# Patient Record
Sex: Male | Born: 1955
Health system: Southern US, Community
[De-identification: ages and names within clinical notes are randomized; demographics above are authoritative.]

## PROBLEM LIST (undated history)

## (undated) DIAGNOSIS — K5790 Diverticulosis of intestine, part unspecified, without perforation or abscess without bleeding: Secondary | ICD-10-CM

## (undated) DIAGNOSIS — D72819 Decreased white blood cell count, unspecified: Secondary | ICD-10-CM

## (undated) DIAGNOSIS — K635 Polyp of colon: Secondary | ICD-10-CM

## (undated) DIAGNOSIS — D571 Sickle-cell disease without crisis: Secondary | ICD-10-CM

## (undated) DIAGNOSIS — M19019 Primary osteoarthritis, unspecified shoulder: Secondary | ICD-10-CM

## (undated) HISTORY — PX: POLYPECTOMY: SHX149

## (undated) HISTORY — DX: Polyp of colon: K63.5

## (undated) HISTORY — PX: OTHER SURGICAL HISTORY: SHX169

## (undated) HISTORY — PX: COLONOSCOPY: SHX174

## (undated) HISTORY — PX: KNEE ARTHROSCOPY: SHX127

## (undated) HISTORY — DX: Diverticulosis of intestine, part unspecified, without perforation or abscess without bleeding: K57.90

## (undated) HISTORY — DX: Primary osteoarthritis, unspecified shoulder: M19.019

## (undated) HISTORY — DX: Sickle-cell disease without crisis: D57.1

---

## 1998-05-25 ENCOUNTER — Ambulatory Visit (HOSPITAL_COMMUNITY): Admission: RE | Admit: 1998-05-25 | Discharge: 1998-05-25 | Payer: Self-pay | Admitting: Family Medicine

## 1999-08-22 ENCOUNTER — Encounter: Admission: RE | Admit: 1999-08-22 | Discharge: 1999-08-22 | Payer: Self-pay | Admitting: Family Medicine

## 1999-08-22 ENCOUNTER — Encounter: Payer: Self-pay | Admitting: Family Medicine

## 2006-10-10 ENCOUNTER — Ambulatory Visit: Payer: Self-pay | Admitting: Gastroenterology

## 2006-10-25 ENCOUNTER — Ambulatory Visit: Payer: Self-pay | Admitting: Gastroenterology

## 2006-10-25 ENCOUNTER — Encounter (INDEPENDENT_AMBULATORY_CARE_PROVIDER_SITE_OTHER): Payer: Self-pay | Admitting: Specialist

## 2010-06-30 ENCOUNTER — Encounter
Admission: RE | Admit: 2010-06-30 | Discharge: 2010-06-30 | Payer: Self-pay | Source: Home / Self Care | Attending: Internal Medicine | Admitting: Internal Medicine

## 2011-09-06 ENCOUNTER — Encounter: Payer: Self-pay | Admitting: Gastroenterology

## 2011-09-11 ENCOUNTER — Encounter: Payer: Self-pay | Admitting: Gastroenterology

## 2011-10-30 ENCOUNTER — Ambulatory Visit (AMBULATORY_SURGERY_CENTER): Payer: 59 | Admitting: *Deleted

## 2011-10-30 ENCOUNTER — Encounter: Payer: Self-pay | Admitting: Gastroenterology

## 2011-10-30 VITALS — Ht 72.0 in | Wt 183.3 lb

## 2011-10-30 DIAGNOSIS — Z860100 Personal history of colon polyps, unspecified: Secondary | ICD-10-CM

## 2011-10-30 DIAGNOSIS — Z1211 Encounter for screening for malignant neoplasm of colon: Secondary | ICD-10-CM

## 2011-10-30 MED ORDER — PEG-KCL-NACL-NASULF-NA ASC-C 100 G PO SOLR: ORAL | Status: DC

## 2011-11-06 ENCOUNTER — Encounter: Payer: Self-pay | Admitting: Gastroenterology

## 2011-11-06 ENCOUNTER — Other Ambulatory Visit: Payer: Self-pay | Admitting: Gastroenterology

## 2011-11-06 ENCOUNTER — Ambulatory Visit (AMBULATORY_SURGERY_CENTER): Payer: 59 | Admitting: Gastroenterology

## 2011-11-06 VITALS — BP 125/68 | HR 60 | Temp 95.4°F | Resp 20 | Ht 72.0 in | Wt 183.0 lb

## 2011-11-06 DIAGNOSIS — Z1211 Encounter for screening for malignant neoplasm of colon: Secondary | ICD-10-CM

## 2011-11-06 DIAGNOSIS — Z86010 Personal history of colonic polyps: Secondary | ICD-10-CM

## 2011-11-06 MED ORDER — SODIUM CHLORIDE 0.9 % IV SOLN: 500.0000 mL | INTRAVENOUS | Status: DC

## 2011-11-06 NOTE — Progress Notes (Signed)
Patient did not experience any of the following events: a burn prior to discharge; a fall within the facility; wrong site/side/patient/procedure/implant event; or a hospital transfer or hospital admission upon discharge from the facility. (G8907) Patient did not have preoperative order for IV antibiotic SSI prophylaxis. (G8918)  

## 2011-11-06 NOTE — Op Note (Signed)
[facility] [address] [place], [place]  [address]  COLONOSCOPY PROCEDURE REPORT  PATIENT:  Charles Cannon, Charles Cannon  MR#:  [id number] BIRTHDATE:  08-28-1955, 56 yrs. old  GENDER:  male ENDOSCOPIST:  Judie Petit T. Russella Dar, MD, [facility]  PROCEDURE DATE:  11/06/2011 PROCEDURE:  Colonoscopy [address]2725 ASA CLASS:  Class II INDICATIONS:  1) Routine Risk Screening  2) history of hyperplastic polyps MEDICATIONS:   These medications were titrated to patient response per physician's verbal order, Fentanyl 50 mcg IV, Versed 5 mg IV DESCRIPTION OF PROCEDURE:   After the risks benefits and alternatives of the procedure were thoroughly explained, informed consent was obtained.  Digital rectal exam was performed and revealed no abnormalities.   The LB CF-[id number] endoscope was introduced through the anus and advanced to the cecum, which was identified by both the appendix and ileocecal valve, without limitations.  The quality of the prep was excellent, using MoviPrep.  The instrument was then slowly withdrawn as the colon was fully examined. <<PROCEDUREIMAGES>> FINDINGS:  A normal appearing cecum, ileocecal valve, and appendiceal orifice were identified. The ascending, hepatic flexure, transverse, splenic flexure, descending, sigmoid colon, and rectum appeared unremarkable.  Retroflexed views in the rectum revealed no abnormalities.  The time to cecum =  1.75  minutes. The scope was then withdrawn (time =  9.25  min) from the patient and the procedure completed.  COMPLICATIONS:  None  ENDOSCOPIC IMPRESSION: 1) Normal colon  RECOMMENDATIONS: 1) Continue current colorectal screening for "routine risk" patients with a repeat colonoscopy in 10 years.  Venita Lick. Russella Dar, MD, [facility]  CC:  Renford Dills MD  n. Rosalie DoctorVenita Lick. Adrina Armijo at 11/06/2011 10:49 AM  Trilby Drummer, [phone]

## 2011-11-06 NOTE — Patient Instructions (Signed)
YOU HAD AN ENDOSCOPIC PROCEDURE TODAY AT THE [facility]: Refer to the procedure report that was given to you for any specific questions about what was found during the examination.  If the procedure report does not answer your questions, please call your gastroenterologist to clarify.  If you requested that your care partner not be given the details of your procedure findings, then the procedure report has been included in a sealed envelope for you to review at your convenience later.  YOU SHOULD EXPECT: Some feelings of bloating in the abdomen. Passage of more gas than usual.  Walking can help get rid of the air that was put into your GI tract during the procedure and reduce the bloating. If you had a lower endoscopy (such as a colonoscopy or flexible sigmoidoscopy) you may notice spotting of blood in your stool or on the toilet paper. If you underwent a bowel prep for your procedure, then you may not have a normal bowel movement for a few days.  DIET: Your first meal following the procedure should be a light meal and then it is ok to progress to your normal diet.  A half-sandwich or bowl of soup is an example of a good first meal.  Heavy or fried foods are harder to digest and may make you feel nauseous or bloated.  Likewise meals heavy in dairy and vegetables can cause extra gas to form and this can also increase the bloating.  Drink plenty of fluids but you should avoid alcoholic beverages for 24 hours.  ACTIVITY: Your care partner should take you home directly after the procedure.  You should plan to take it easy, moving slowly for the rest of the day.  You can resume normal activity the day after the procedure however you should NOT DRIVE or use heavy machinery for 24 hours (because of the sedation medicines used during the test).    SYMPTOMS TO REPORT IMMEDIATELY: A gastroenterologist can be reached at any hour.  During normal business hours, 8:30 AM to 5:00 PM Monday through Friday,  call [phone].  After hours and on weekends, please call the GI answering service at [phone] who will take a message and have the physician on call contact you.   Following lower endoscopy (colonoscopy or flexible sigmoidoscopy):  Excessive amounts of blood in the stool  Significant tenderness or worsening of abdominal pains  Swelling of the abdomen that is new, acute  Fever of 100F or higher  FOLLOW UP: If any biopsies were taken you will be contacted by phone or by letter within the next 1-3 weeks.  Call your gastroenterologist if you have not heard about the biopsies in 3 weeks.  Our staff will call the home number listed on your records the next business day following your procedure to check on you and address any questions or concerns that you may have at that time regarding the information given to you following your procedure. This is a courtesy call and so if there is no answer at the home number and we have not heard from you through the emergency physician on call, we will assume that you have returned to your regular daily activities without incident.  SIGNATURES/CONFIDENTIALITY: You and/or your care partner have signed paperwork which will be entered into your electronic medical record.  These signatures attest to the fact that that the information above on your After Visit Summary has been reviewed and is understood.  Full responsibility of the confidentiality of this   discharge information lies with you and/or your care-partner.  Resume medications. 

## 2011-11-07 ENCOUNTER — Telehealth: Payer: Self-pay

## 2011-11-07 NOTE — Telephone Encounter (Signed)
  Follow up Call-  Call back number 11/06/2011  Post procedure Call Back phone  # [phone]  Permission to leave phone message Yes     Patient questions:  Do you have a fever, pain , or abdominal swelling? no Pain Score  0 *  Have you tolerated food without any problems? yes  Have you been able to return to your normal activities? yes  Do you have any questions about your discharge instructions: Diet   no Medications  no Follow up visit  no  Do you have questions or concerns about your Care? no  Actions: * If pain score is 4 or above: No action needed, pain <4.

## 2013-06-20 ENCOUNTER — Telehealth: Payer: Self-pay | Admitting: Internal Medicine

## 2013-06-20 NOTE — Telephone Encounter (Signed)
C/D 07/10/13 for appt. 06/20/13

## 2013-06-20 NOTE — Telephone Encounter (Signed)
S/W PT AND GVE NP APPT 12/10 @ 11 W/DR. MOHAMED.  REFERRING DR. RON POLITE DX- LOW WBC/LEUKOPENIA WELCOME PACKET MAILED.

## 2013-06-24 ENCOUNTER — Other Ambulatory Visit: Payer: Self-pay | Admitting: Medical Oncology

## 2013-06-24 DIAGNOSIS — D72819 Decreased white blood cell count, unspecified: Secondary | ICD-10-CM

## 2013-06-25 ENCOUNTER — Encounter: Payer: Self-pay | Admitting: Internal Medicine

## 2013-06-25 ENCOUNTER — Other Ambulatory Visit: Payer: Self-pay | Admitting: Internal Medicine

## 2013-06-25 ENCOUNTER — Ambulatory Visit (HOSPITAL_BASED_OUTPATIENT_CLINIC_OR_DEPARTMENT_OTHER): Payer: 59

## 2013-06-25 ENCOUNTER — Telehealth: Payer: Self-pay | Admitting: Internal Medicine

## 2013-06-25 ENCOUNTER — Encounter (INDEPENDENT_AMBULATORY_CARE_PROVIDER_SITE_OTHER): Payer: Self-pay

## 2013-06-25 ENCOUNTER — Ambulatory Visit: Payer: 59

## 2013-06-25 ENCOUNTER — Ambulatory Visit (HOSPITAL_BASED_OUTPATIENT_CLINIC_OR_DEPARTMENT_OTHER): Payer: 59 | Admitting: Internal Medicine

## 2013-06-25 VITALS — BP 137/81 | HR 79 | Temp 98.0°F | Resp 18 | Ht 72.0 in | Wt 190.0 lb

## 2013-06-25 DIAGNOSIS — D72819 Decreased white blood cell count, unspecified: Secondary | ICD-10-CM | POA: Insufficient documentation

## 2013-06-25 LAB — IRON AND TIBC CHCC: UIBC: 227 ug/dL (ref 117–376)

## 2013-06-25 LAB — COMPREHENSIVE METABOLIC PANEL (CC13)
ALT: 37 U/L (ref 0–55)
AST: 35 U/L — ABNORMAL HIGH (ref 5–34)
Albumin: 4.3 g/dL (ref 3.5–5.0)
Anion Gap: 10 mEq/L (ref 3–11)
BUN: 11 mg/dL (ref 7.0–26.0)
CO2: 22 mEq/L (ref 22–29)
Creatinine: 1.1 mg/dL (ref 0.7–1.3)
Total Bilirubin: 0.66 mg/dL (ref 0.20–1.20)

## 2013-06-25 LAB — CBC WITH DIFFERENTIAL/PLATELET
BASO%: 0.8 % (ref 0.0–2.0)
EOS%: 4.2 % (ref 0.0–7.0)
Eosinophils Absolute: 0.1 10*3/uL (ref 0.0–0.5)
HCT: 48.6 % (ref 38.4–49.9)
LYMPH%: 44.4 % (ref 14.0–49.0)
MCH: 33.5 pg — ABNORMAL HIGH (ref 27.2–33.4)
MCHC: 34.2 g/dL (ref 32.0–36.0)
MCV: 98.1 fL — ABNORMAL HIGH (ref 79.3–98.0)
NEUT%: 30.5 % — ABNORMAL LOW (ref 39.0–75.0)
Platelets: 176 10*3/uL (ref 140–400)
RBC: 4.95 10*6/uL (ref 4.20–5.82)

## 2013-06-25 LAB — FERRITIN CHCC: Ferritin: 329 ng/ml — ABNORMAL HIGH (ref 22–316)

## 2013-06-25 NOTE — Patient Instructions (Signed)
Followup visit in 2 weeks for evaluation and discussion of the pending lab results. 

## 2013-06-25 NOTE — Progress Notes (Signed)
Checked in new pt with no financial concerns. °

## 2013-06-25 NOTE — Progress Notes (Signed)
[facility] [phone]   [phone]  CONSULT NOTE  REFERRING PHYSICIAN: Dr. Renford Dills  REASON FOR CONSULTATION:  57 years old African American male with persistent Leukocytopenia  HPI Charles Cannon is a 57 y.o. male with no significant past medical history except for diverticulosis as well as cervical and lumbar disc disease and low back pain. The patient was seen recently by Dr. Nehemiah Settle for routine evaluation and annual exam. He had CBC performed on 06/17/2013 which showed white blood count of 2.1 with absolute neutrophil count of 600. The patient has normal hemoglobin of 15.6 and hematocrit 46.7% as well as normal platelets count of 190,000. BMP CBC on 05/31/2012 showed that your white blood count of 2.3 with absolute neutrophil count of 600. On 07/24/2012, his total white blood count was 2.9 with absolute neutrophil count of 1000. The patient mentions that he has not white blood count for more than 15 years. His primary care physician at that time Dr. Artis Flock was monitoring it by observation.  Dr. polite kindly referred the patient to me today for evaluation and recommendation regarding his persistent leukocytopenia. The patient is feeling fine with no specific complaints. He denied having any significant weight loss or night sweats. He has no palpable lymphadenopathy. He has no rheumatologic disorders and no history of hepatitis or HIV. He does not use any herbs or over the counter pain medication except occasional ibuprofen and Aleve for her back pain. He has never seen a hematologist in the past for evaluation of his condition and no bone marrow biopsy and aspirate were performed. He denied having any recurrent infection. His family history is remarkable for a mother with hypertension, dementia and stroke and maternal aunt with stomach cancer. The patient is married and has one biological daughter and 2 stepchildren. He works in several jobs including  transportation. He has no history of smoking but drinks around 6 packs of beer every weekend and no history of drug abuse. HPI  Past Medical History  Diagnosis Date  . Sickle cell anemia     has the trait  . Inflammation of shoulder joint     right    Past Surgical History  Procedure Laterality Date  . Tooth implant    . Colonoscopy    . Polypectomy      Family History  Problem Relation Age of Onset  . Colon cancer Neg Hx   . Esophageal cancer Neg Hx   . Rectal cancer Neg Hx   . Diabetes Brother   . Heart disease Brother   . Stomach cancer Maternal Aunt     Social History History  Substance Use Topics  . Smoking status: Never Smoker   . Smokeless tobacco: Never Used  . Alcohol Use: 3.6 oz/week    6 Cans of beer per week    No Known Allergies  Current Outpatient Prescriptions  Medication Sig Dispense Refill  . NON FORMULARY [employer] vital pack- takes 1 pack of vitamins daily      . VITAMIN E PO Take 1 tablet by mouth daily. Has fish oil, prostate vitamin      . CIALIS 20 MG tablet Take 20 mg by mouth as needed.       Current Facility-Administered Medications  Medication Dose Route Frequency Provider Last Rate Last Dose  . 0.9 %  sodium chloride infusion  500 mL Intravenous Continuous Meryl Dare, MD        Review of Systems  Constitutional:  negative Eyes: negative Ears, nose, mouth, throat, and face: negative Respiratory: negative Cardiovascular: negative Gastrointestinal: negative Genitourinary:negative Integument/breast: negative Hematologic/lymphatic: negative Musculoskeletal:positive for back pain Neurological: negative Behavioral/Psych: negative Endocrine: negative Allergic/Immunologic: negative  Physical Exam  WUJ:WJXBJ, healthy, no distress, well nourished and well developed SKIN: skin color, texture, turgor are normal, no rashes or significant lesions HEAD: Normocephalic, No masses, lesions, tenderness or abnormalities EYES: normal,  PERRLA EARS: External ears normal, Canals clear OROPHARYNX:no exudate, no erythema and lips, buccal mucosa, and tongue normal  NECK: supple, no adenopathy, no JVD LYMPH:  no palpable lymphadenopathy, no hepatosplenomegaly LUNGS: clear to auscultation , and palpation HEART: regular rate & rhythm, no murmurs and no gallops ABDOMEN:abdomen soft, non-tender, normal bowel sounds and no masses or organomegaly BACK: Back symmetric, no curvature., No CVA tenderness EXTREMITIES:no joint deformities, effusion, or inflammation, no edema, no skin discoloration, no clubbing  NEURO: alert & oriented x 3 with fluent speech, no focal motor/sensory deficits  PERFORMANCE STATUS: ECOG 0  LABORATORY DATA: Lab Results  Component Value Date   WBC 2.2* 06/25/2013   HGB 16.6 06/25/2013   HCT 48.6 06/25/2013   MCV 98.1* 06/25/2013   PLT 176 06/25/2013      Chemistry      Component Value Date/Time   NA 138 06/25/2013 1105   K 4.1 06/25/2013 1105   CO2 22 06/25/2013 1105   BUN 11.0 06/25/2013 1105   CREATININE 1.1 06/25/2013 1105      Component Value Date/Time   CALCIUM 9.9 06/25/2013 1105   ALKPHOS 53 06/25/2013 1105   AST 35* 06/25/2013 1105   ALT 37 06/25/2013 1105   BILITOT 0.66 06/25/2013 1105       RADIOGRAPHIC STUDIES: No results found.  ASSESSMENT: This is a very pleasant 57 years old [place] American male with persistent leukocytopenia and neutropenia most likely ethnic in origin but I cannot rule out any other etiology at this point. The patient has this abnormality for more than 15 years.   PLAN: I have a lengthy discussion with the patient today about his condition. I ordered several studies to evaluate his leukocytopenia including repeat CBC, comprehensive metabolic panel, LDH, vitamin B12, serum folate, ANA, rheumatoid factor, hepatitis panel and HIV. If no clear etiology from the above studies, I may consider the patient for a bone marrow biopsy and aspirate to rule out any bone  marrow abnormalities. I would see the patient back for followup visit in 2 weeks for reevaluation and discussion of his lab results. He was advised to call immediately if he has any concerning symptoms in the interval.  The patient voices understanding of current disease status and treatment options and is in agreement with the current care plan.  All questions were answered. The patient knows to call the clinic with any problems, questions or concerns. We can certainly see the patient much sooner if necessary.  Thank you so much for allowing me to participate in the care of Charles Cannon. I will continue to follow up the patient with you and assist in his care.  I spent 35 minutes counseling the patient face to face. The total time spent in the appointment was 55 minutes.  Jack Mineau K. 06/25/2013, 12:03 PM

## 2013-06-25 NOTE — Telephone Encounter (Signed)
gv and printed appt sched and avs for pt for DEc °

## 2013-06-26 LAB — HEPATITIS PANEL, ACUTE
HCV Ab: NEGATIVE
Hep A IgM: NONREACTIVE
Hep B C IgM: NONREACTIVE
Hepatitis B Surface Ag: NEGATIVE

## 2013-06-26 LAB — FOLATE: Folate: 16.3 ng/mL

## 2013-06-26 LAB — ANA: Anti Nuclear Antibody(ANA): POSITIVE — AB

## 2013-06-26 LAB — HIV ANTIBODY (ROUTINE TESTING W REFLEX): HIV: NONREACTIVE

## 2013-06-26 LAB — RHEUMATOID FACTOR: Rhuematoid fact SerPl-aCnc: <10 IU/mL (ref <=14)

## 2013-06-26 LAB — VITAMIN B12: Vitamin B-12: 588 pg/mL (ref 211–911)

## 2013-07-02 ENCOUNTER — Emergency Department (HOSPITAL_COMMUNITY): Payer: 59

## 2013-07-02 ENCOUNTER — Emergency Department (HOSPITAL_COMMUNITY)
Admission: EM | Admit: 2013-07-02 | Discharge: 2013-07-02 | Disposition: A | Discharge status: Home / Self Care | Payer: 59 | Attending: Emergency Medicine | Admitting: Emergency Medicine

## 2013-07-02 ENCOUNTER — Encounter (HOSPITAL_COMMUNITY): Payer: Self-pay | Admitting: Emergency Medicine

## 2013-07-02 DIAGNOSIS — Z862 Personal history of diseases of the blood and blood-forming organs and certain disorders involving the immune mechanism: Secondary | ICD-10-CM | POA: Insufficient documentation

## 2013-07-02 DIAGNOSIS — R197 Diarrhea, unspecified: Secondary | ICD-10-CM | POA: Insufficient documentation

## 2013-07-02 DIAGNOSIS — Z8739 Personal history of other diseases of the musculoskeletal system and connective tissue: Secondary | ICD-10-CM | POA: Insufficient documentation

## 2013-07-02 DIAGNOSIS — R1084 Generalized abdominal pain: Secondary | ICD-10-CM | POA: Insufficient documentation

## 2013-07-02 DIAGNOSIS — R109 Unspecified abdominal pain: Secondary | ICD-10-CM

## 2013-07-02 HISTORY — DX: Decreased white blood cell count, unspecified: D72.819

## 2013-07-02 LAB — CBC WITH DIFFERENTIAL/PLATELET
Basophils Absolute: 0 10*3/uL (ref 0.0–0.1)
Basophils Relative: 1 % (ref 0–1)
HCT: 45.4 % (ref 39.0–52.0)
Hemoglobin: 15.6 g/dL (ref 13.0–17.0)
Lymphocytes Relative: 26 % (ref 12–46)
MCHC: 34.4 g/dL (ref 30.0–36.0)
Monocytes Absolute: 0.3 10*3/uL (ref 0.1–1.0)
Monocytes Relative: 12 % (ref 3–12)
Neutro Abs: 1.5 10*3/uL — ABNORMAL LOW (ref 1.7–7.7)
Neutrophils Relative %: 61 % (ref 43–77)
RBC: 4.83 MIL/uL (ref 4.22–5.81)
RDW: 13.2 % (ref 11.5–15.5)
WBC: 2.4 10*3/uL — ABNORMAL LOW (ref 4.0–10.5)

## 2013-07-02 LAB — COMPREHENSIVE METABOLIC PANEL
ALT: 32 U/L (ref 0–53)
AST: 38 U/L — ABNORMAL HIGH (ref 0–37)
Albumin: 4.4 g/dL (ref 3.5–5.2)
Alkaline Phosphatase: 48 U/L (ref 39–117)
BUN: 11 mg/dL (ref 6–23)
CO2: 25 mEq/L (ref 19–32)
Chloride: 98 mEq/L (ref 96–112)
Creatinine, Ser: 1 mg/dL (ref 0.50–1.35)
GFR calc non Af Amer: 81 mL/min — ABNORMAL LOW (ref >90)
Potassium: 4.2 mEq/L (ref 3.5–5.1)
Sodium: 133 mEq/L — ABNORMAL LOW (ref 135–145)
Total Bilirubin: 0.6 mg/dL (ref 0.3–1.2)
Total Protein: 8.4 g/dL — ABNORMAL HIGH (ref 6.0–8.3)

## 2013-07-02 LAB — URINALYSIS W MICROSCOPIC + REFLEX CULTURE
Glucose, UA: NEGATIVE mg/dL
Hgb urine dipstick: NEGATIVE
Leukocytes, UA: NEGATIVE
Specific Gravity, Urine: 1.018 (ref 1.005–1.030)
Urine-Other: NONE SEEN
Urobilinogen, UA: 0.2 mg/dL (ref 0.0–1.0)

## 2013-07-02 LAB — LACTIC ACID, PLASMA: Lactic Acid, Venous: 1.8 mmol/L (ref 0.5–2.2)

## 2013-07-02 MED ORDER — SODIUM CHLORIDE 0.9 % IV SOLN
INTRAVENOUS | Status: DC
  Administered 2013-07-02: 14:00:00 via INTRAVENOUS

## 2013-07-02 MED ORDER — IOHEXOL 300 MG/ML  SOLN
100.0000 mL | Freq: Once | INTRAMUSCULAR | Status: AC | PRN
  Administered 2013-07-02: 100 mL via INTRAVENOUS

## 2013-07-02 MED ORDER — DICYCLOMINE HCL 20 MG PO TABS: ORAL_TABLET | ORAL | Status: DC

## 2013-07-02 MED ORDER — IOHEXOL 300 MG/ML  SOLN
50.0000 mL | Freq: Once | INTRAMUSCULAR | Status: AC | PRN
  Administered 2013-07-02: 50 mL via ORAL

## 2013-07-02 MED ORDER — DIPHENOXYLATE-ATROPINE 2.5-0.025 MG PO TABS: 1.0000 | ORAL_TABLET | Freq: Four times a day (QID) | ORAL | Status: DC | PRN

## 2013-07-02 MED ORDER — TRAMADOL HCL 50 MG PO TABS: 50.0000 mg | ORAL_TABLET | Freq: Four times a day (QID) | ORAL | Status: DC | PRN

## 2013-07-02 NOTE — ED Provider Notes (Signed)
CSN: [id number]     Arrival date & time 07/02/13  1257 History   First MD Initiated Contact with Patient 07/02/13 1317     Chief Complaint  Patient presents with  . Abdominal Pain  . Diarrhea    HPI Pt was seen at 1340.  Per pt, c/o gradual onset and persistence of constant generalized abd "pain" since this morning approximately 0500.  Has been associated with multiple intermittent episodes of "loose stools."  Describes the abd pain as "constant." Pt was evaluated at a local [facility], then sent to the ED for further evaluation. Denies diarrhea, no N/V, no fevers, no back pain, no rash, no CP/SOB, no black or blood in stools.       Past Medical History  Diagnosis Date  . Sickle cell anemia     has the trait  . Inflammation of shoulder joint     right  . Leukopenia    Past Surgical History  Procedure Laterality Date  . Tooth implant    . Colonoscopy    . Polypectomy     Family History  Problem Relation Age of Onset  . Colon cancer Neg Hx   . Esophageal cancer Neg Hx   . Rectal cancer Neg Hx   . Diabetes Brother   . Heart disease Brother   . Stomach cancer Maternal Aunt    History  Substance Use Topics  . Smoking status: Never Smoker   . Smokeless tobacco: Never Used  . Alcohol Use: 3.6 oz/week    6 Cans of beer per week    Review of Systems ROS: Statement: All systems negative except as marked or noted in the HPI; Constitutional: Negative for fever and chills. ; ; Eyes: Negative for eye pain, redness and discharge. ; ; ENMT: Negative for ear pain, hoarseness, nasal congestion, sinus pressure and sore throat. ; ; Cardiovascular: Negative for chest pain, palpitations, diaphoresis, dyspnea and peripheral edema. ; ; Respiratory: Negative for cough, wheezing and stridor. ; ; Gastrointestinal: +"loose stools," abd pain. Negative for nausea, vomiting, blood in stool, hematemesis, jaundice and rectal bleeding. . ; ; Genitourinary: Negative for dysuria, flank pain and hematuria. ; ;  Musculoskeletal: Negative for back pain and neck pain. Negative for swelling and trauma.; ; Skin: Negative for pruritus, rash, abrasions, blisters, bruising and skin lesion.; ; Neuro: Negative for headache, lightheadedness and neck stiffness. Negative for weakness, altered level of consciousness , altered mental status, extremity weakness, paresthesias, involuntary movement, seizure and syncope.      Allergies  Review of patient's allergies indicates no known allergies.  Home Medications   Current Outpatient Rx  Name  Route  Sig  Dispense  Refill  . CIALIS 20 MG tablet   Oral   Take 20 mg by mouth daily as needed for erectile dysfunction.          . NON FORMULARY   Oral   Take 1 packet by mouth daily. [employer] vital pack- multi-vitamin pack          BP 139/80  Pulse 59  Temp(Src) 98.3 F (36.8 C) (Oral)  Resp 20  SpO2 98% Physical Exam 1345: Physical examination:  Nursing notes reviewed; Vital signs and O2 SAT reviewed;  Constitutional: Well developed, Well nourished, Well hydrated, In no acute distress; Head:  Normocephalic, atraumatic; Eyes: EOMI, PERRL, No scleral icterus; ENMT: Mouth and pharynx normal, Mucous membranes moist; Neck: Supple, Full range of motion, No lymphadenopathy; Cardiovascular: Regular rate and rhythm, No gallop; Respiratory: Breath sounds clear &  equal bilaterally, No wheezes.  Speaking full sentences with ease, Normal respiratory effort/excursion; Chest: Nontender, Movement normal; Abdomen: Soft, +mild diffuse tenderness to palp. No rebound or guarding. Nondistended, Normal bowel sounds; Genitourinary: No CVA tenderness; Extremities: Pulses normal, No tenderness, No edema, No calf edema or asymmetry.; Neuro: AA&Ox3, Major CN grossly intact.  Speech clear. No gross focal motor or sensory deficits in extremities.; Skin: Color normal, Warm, Dry.   ED Course  Procedures    EKG Interpretation   None       MDM  MDM Reviewed: previous chart, nursing note  and vitals Reviewed previous: labs Interpretation: labs and x-ray   Results for orders placed during the hospital encounter of 07/02/13  URINALYSIS W MICROSCOPIC + REFLEX CULTURE      Result Value Range   Color, Urine YELLOW  YELLOW   APPearance CLEAR  CLEAR   Specific Gravity, Urine 1.018  1.005 - 1.030   pH 7.0  5.0 - 8.0   Glucose, UA NEGATIVE  NEGATIVE mg/dL   Hgb urine dipstick NEGATIVE  NEGATIVE   Bilirubin Urine NEGATIVE  NEGATIVE   Ketones, ur NEGATIVE  NEGATIVE mg/dL   Protein, ur NEGATIVE  NEGATIVE mg/dL   Urobilinogen, UA 0.2  0.0 - 1.0 mg/dL   Nitrite NEGATIVE  NEGATIVE   Leukocytes, UA NEGATIVE  NEGATIVE   Urine-Other       Value: NO FORMED ELEMENTS SEEN ON URINE MICROSCOPIC EXAMINATION  CBC WITH DIFFERENTIAL      Result Value Range   WBC 2.4 (*) 4.0 - 10.5 K/uL   RBC 4.83  4.22 - 5.81 MIL/uL   Hemoglobin 15.6  13.0 - 17.0 g/dL   HCT 09.8  11.9 - 14.7 %   MCV 94.0  78.0 - 100.0 fL   MCH 32.3  26.0 - 34.0 pg   MCHC 34.4  30.0 - 36.0 g/dL   RDW 82.9  56.2 - 13.0 %   Platelets 197  150 - 400 K/uL   Neutrophils Relative % 61  43 - 77 %   Neutro Abs 1.5 (*) 1.7 - 7.7 K/uL   Lymphocytes Relative 26  12 - 46 %   Lymphs Abs 0.6 (*) 0.7 - 4.0 K/uL   Monocytes Relative 12  3 - 12 %   Monocytes Absolute 0.3  0.1 - 1.0 K/uL   Eosinophils Relative 0  0 - 5 %   Eosinophils Absolute 0.0  0.0 - 0.7 K/uL   Basophils Relative 1  0 - 1 %   Basophils Absolute 0.0  0.0 - 0.1 K/uL  COMPREHENSIVE METABOLIC PANEL      Result Value Range   Sodium 133 (*) 135 - 145 mEq/L   Potassium 4.2  3.5 - 5.1 mEq/L   Chloride 98  96 - 112 mEq/L   CO2 25  19 - 32 mEq/L   Glucose, Bld 102 (*) 70 - 99 mg/dL   BUN 11  6 - 23 mg/dL   Creatinine, Ser 8.65  0.50 - 1.35 mg/dL   Calcium 9.4  8.4 - 78.4 mg/dL   Total Protein 8.4 (*) 6.0 - 8.3 g/dL   Albumin 4.4  3.5 - 5.2 g/dL   AST 38 (*) 0 - 37 U/L   ALT 32  0 - 53 U/L   Alkaline Phosphatase 48  39 - 117 U/L   Total Bilirubin 0.6  0.3 - 1.2  mg/dL   GFR calc non Af Amer 81 (*) >90 mL/min   GFR calc  Af Amer >90  >90 mL/min  LIPASE, BLOOD      Result Value Range   Lipase 27  11 - 59 U/L  LACTIC ACID, PLASMA      Result Value Range   Lactic Acid, Venous 1.8  0.5 - 2.2 mmol/L   Dg Chest 2 View 07/02/2013   CLINICAL DATA:  Abdominal pain.  EXAM: CHEST  2 VIEW  COMPARISON:  None.  FINDINGS: Trachea is midline. Heart size normal. Lungs are clear. No pleural fluid.  IMPRESSION: No acute findings.   Electronically Signed   By: Leanna Battles M.D.   On: 07/02/2013 15:08    Results for RAMEL, TOBON (MRN [id number]) as of 07/02/2013 15:56  Ref. Range 06/25/2013 11:05 07/02/2013 14:01  WBC Latest Range: 4.0-10.5 K/uL 2.2 (L) 2.4 (L)     1600:   CT A/P pending. Sign out to Dr. Fayrene Fearing.     Laray Anger, DO 07/02/13 [phone]

## 2013-07-02 NOTE — ED Provider Notes (Signed)
Care was discussed between myself and Dr. Clarene Duke. Was asked to follow up with his CT scans. CT is reported as normal and without acute pathology by radiology. Patient is without symptoms now his abdomen is benign. His discharge home. Prescription Bentyl and Lomotil.  Charles Marion, MD 07/02/13 [phone]

## 2013-07-02 NOTE — ED Notes (Signed)
Pt c/o abd pain since 0500 this morning along with diarrhea, states he went abut 3 times this morning. Denies n/v.

## 2013-07-02 NOTE — ED Notes (Signed)
Pt given urinal and made aware of need for urine specimen 

## 2013-07-02 NOTE — ED Notes (Signed)
Pt sent by [facility].  C/o abdominal pain and "loose" stool x 2.

## 2013-07-09 ENCOUNTER — Telehealth: Payer: Self-pay | Admitting: Medical Oncology

## 2013-07-09 ENCOUNTER — Other Ambulatory Visit: Payer: Self-pay | Admitting: Medical Oncology

## 2013-07-09 ENCOUNTER — Other Ambulatory Visit (HOSPITAL_BASED_OUTPATIENT_CLINIC_OR_DEPARTMENT_OTHER): Payer: 59

## 2013-07-09 ENCOUNTER — Encounter: Payer: Self-pay | Admitting: Internal Medicine

## 2013-07-09 ENCOUNTER — Telehealth: Payer: Self-pay | Admitting: Internal Medicine

## 2013-07-09 ENCOUNTER — Telehealth: Payer: Self-pay | Admitting: *Deleted

## 2013-07-09 ENCOUNTER — Ambulatory Visit (HOSPITAL_BASED_OUTPATIENT_CLINIC_OR_DEPARTMENT_OTHER): Payer: 59 | Admitting: Internal Medicine

## 2013-07-09 ENCOUNTER — Ambulatory Visit (HOSPITAL_BASED_OUTPATIENT_CLINIC_OR_DEPARTMENT_OTHER): Payer: 59

## 2013-07-09 VITALS — BP 141/86 | HR 84 | Temp 97.8°F | Resp 18 | Ht 72.0 in | Wt 189.0 lb

## 2013-07-09 DIAGNOSIS — D709 Neutropenia, unspecified: Secondary | ICD-10-CM

## 2013-07-09 DIAGNOSIS — D72819 Decreased white blood cell count, unspecified: Secondary | ICD-10-CM

## 2013-07-09 LAB — CBC WITH DIFFERENTIAL/PLATELET
BASO%: 0.4 % (ref 0.0–2.0)
Basophils Absolute: 0 10*3/uL (ref 0.0–0.1)
EOS%: 2.5 % (ref 0.0–7.0)
HCT: 45.7 % (ref 38.4–49.9)
HGB: 15.9 g/dL (ref 13.0–17.1)
LYMPH%: 64.4 % — ABNORMAL HIGH (ref 14.0–49.0)
MCH: 33.1 pg (ref 27.2–33.4)
MCHC: 34.8 g/dL (ref 32.0–36.0)
MCV: 95 fL (ref 79.3–98.0)
MONO%: 13.6 % (ref 0.0–14.0)
NEUT#: 0.5 10*3/uL — CL (ref 1.5–6.5)
NEUT%: 19.1 % — ABNORMAL LOW (ref 39.0–75.0)
Platelets: 201 10*3/uL (ref 140–400)
RDW: 13.8 % (ref 11.0–14.6)
WBC: 2.4 10*3/uL — ABNORMAL LOW (ref 4.0–10.3)
lymph#: 1.5 10*3/uL (ref 0.9–3.3)
nRBC: 0 % (ref 0–0)

## 2013-07-09 MED ORDER — FILGRASTIM 480 MCG/0.8ML IJ SOLN
480.0000 ug | Freq: Once | INTRAMUSCULAR | Status: AC
  Administered 2013-07-09: 480 ug via SUBCUTANEOUS
  Filled 2013-07-09: qty 0.8

## 2013-07-09 NOTE — Telephone Encounter (Signed)
SW pt adv time of bx on 1/21 shh

## 2013-07-09 NOTE — Patient Instructions (Signed)
Bone marrow biopsy and aspirate on 07/25/2013 Follow up visit in one month

## 2013-07-09 NOTE — Telephone Encounter (Signed)
Dr Arbutus Ped request BM asp and Bx on jan 9th . onc tx request sent to scheduler.

## 2013-07-09 NOTE — Progress Notes (Signed)
[facility] [phone]   [phone]  OFFICE PROGRESS NOTE  Katy Apo, MD [address] [place] [address]  DIAGNOSIS: Leukocytopenia and neutropenia of unknown etiology.  PRIOR THERAPY: None  CURRENT THERAPY: None  INTERVAL HISTORY: Charles Cannon 57 y.o. male returns to the clinic today for follow up visit. The patient is feeling fine today with no specific complaints. He was recently seen at the emergency department after having viral gastroenteritis for 24 hours. He is feeling much better today. He had several studies performed recently for evaluation of leukocytopenia and neutropenia. The studies include iron study and ferritin that was normal. Rheumatoid factor was less than 10, ANA positive with a titer of 1:40 speckled. Hepatitis panel and HIV were negative. Serum folate and vitamin B12 were both normal. The patient is here today for evaluation and discussion of his lab results and recommendation regarding his condition.  MEDICAL HISTORY: Past Medical History  Diagnosis Date  . Sickle cell anemia     has the trait  . Inflammation of shoulder joint     right  . Leukopenia     ALLERGIES:  has No Known Allergies.  MEDICATIONS:  Current Outpatient Prescriptions  Medication Sig Dispense Refill  . NON FORMULARY Take 1 packet by mouth daily. [employer] vital pack- multi-vitamin pack      . CIALIS 20 MG tablet Take 20 mg by mouth daily as needed for erectile dysfunction.        Current Facility-Administered Medications  Medication Dose Route Frequency Provider Last Rate Last Dose  . 0.9 %  sodium chloride infusion  500 mL Intravenous Continuous Meryl Dare, MD        SURGICAL HISTORY:  Past Surgical History  Procedure Laterality Date  . Tooth implant    . Colonoscopy    . Polypectomy      REVIEW OF SYSTEMS:  A comprehensive review of systems was negative.   PHYSICAL EXAMINATION: General appearance:  alert, cooperative and no distress Head: Normocephalic, without obvious abnormality, atraumatic Neck: no adenopathy, no JVD, supple, symmetrical, trachea midline and thyroid not enlarged, symmetric, no tenderness/mass/nodules Lymph nodes: Cervical, supraclavicular, and axillary nodes normal. Resp: clear to auscultation bilaterally Back: symmetric, no curvature. ROM normal. No CVA tenderness. Cardio: regular rate and rhythm, S1, S2 normal, no murmur, click, rub or gallop GI: soft, non-tender; bowel sounds normal; no masses,  no organomegaly Extremities: extremities normal, atraumatic, no cyanosis or edema  ECOG PERFORMANCE STATUS: 0 - Asymptomatic  Blood pressure 141/86, pulse 84, temperature 97.8 F (36.6 C), temperature source Oral, resp. rate 18, height 6' (1.829 m), weight 189 lb (85.73 kg), SpO2 100.00%.  LABORATORY DATA: Lab Results  Component Value Date   WBC 2.4* 07/09/2013   HGB 15.9 07/09/2013   HCT 45.7 07/09/2013   MCV 95.0 07/09/2013   PLT 201 07/09/2013      Chemistry      Component Value Date/Time   NA 133* 07/02/2013 1401   NA 138 06/25/2013 1105   K 4.2 07/02/2013 1401   K 4.1 06/25/2013 1105   CL 98 07/02/2013 1401   CO2 25 07/02/2013 1401   CO2 22 06/25/2013 1105   BUN 11 07/02/2013 1401   BUN 11.0 06/25/2013 1105   CREATININE 1.00 07/02/2013 1401   CREATININE 1.1 06/25/2013 1105      Component Value Date/Time   CALCIUM 9.4 07/02/2013 1401   CALCIUM 9.9 06/25/2013 1105   ALKPHOS  48 07/02/2013 1401   ALKPHOS 53 06/25/2013 1105   AST 38* 07/02/2013 1401   AST 35* 06/25/2013 1105   ALT 32 07/02/2013 1401   ALT 37 06/25/2013 1105   BILITOT 0.6 07/02/2013 1401   BILITOT 0.66 06/25/2013 1105       RADIOGRAPHIC STUDIES: Dg Chest 2 View  07/02/2013   CLINICAL DATA:  Abdominal pain.  EXAM: CHEST  2 VIEW  COMPARISON:  None.  FINDINGS: Trachea is midline. Heart size normal. Lungs are clear. No pleural fluid.  IMPRESSION: No acute findings.    Electronically Signed   By: Leanna Battles M.D.   On: 07/02/2013 15:08   Ct Abdomen Pelvis W Contrast  07/02/2013   CLINICAL DATA:  Abdominal pain and diarrhea.  EXAM: CT ABDOMEN AND PELVIS WITH CONTRAST  TECHNIQUE: Multidetector CT imaging of the abdomen and pelvis was performed using the standard protocol following bolus administration of intravenous contrast.  CONTRAST:  OMNIPAQUE IOHEXOL 300 MG/ML  SOLN  COMPARISON:  06/30/2010  FINDINGS: Lung bases are clear.  No pleural or pericardial fluid.  The liver has a normal appearance without focal lesions or biliary ductal dilatation. No calcified gallstones. The spleen is normal. The pancreas is normal. The adrenal glands are normal. The kidneys are normal. No cysts, mass, stone or hydronephrosis. The aorta and IVC are normal. No retroperitoneal mass or adenopathy. No free intraperitoneal fluid or air. Bladder appears normal.  The bowel appears unremarkable. No dilated loops. No areas of wall thickening. The appendix is normal.  IMPRESSION: Normal CT scan of the abdomen and pelvis. No bowel or organ pathology evident.   Electronically Signed   By: Paulina Fusi M.D.   On: 07/02/2013 16:12    ASSESSMENT AND PLAN: this is a very pleasant 57 years old [place] American male with persistent leukocytosis and neutropenia of unclear etiology at this point. His absolute neutrophil count is 500 today and this could be the result of recent viral gastroenteritis. I recommend for the patient to have a Neupogen injection 480 mcg subcutaneously x1 dose today to improve his neutrophil count. I also discussed with the patient and proceeding with a bone marrow biopsy and aspirate to rule out any other bone marrow abnormalities causing the leukocytopenia and neutropenia. He would come back for follow up visit in one month's for reevaluation and discussion of his biopsy results. He was advised to call immediately if he has any concerning symptoms in the interval. The  patient voices understanding of current disease status and treatment options and is in agreement with the current care plan.  All questions were answered. The patient knows to call the clinic with any problems, questions or concerns. We can certainly see the patient much sooner if necessary.  I spent 15 minutes counseling the patient face to face. The total time spent in the appointment was 25 minutes.

## 2013-07-09 NOTE — Telephone Encounter (Signed)
appts made per 12/24 POF email to MW to add Bone Marrow Bx Call pt at cell number for time of Bx on 01/21 when hear back from [facility] shh

## 2013-07-09 NOTE — Telephone Encounter (Signed)
Per staff message and POF I have scheduled appts.  JMW  

## 2013-07-25 ENCOUNTER — Ambulatory Visit (HOSPITAL_BASED_OUTPATIENT_CLINIC_OR_DEPARTMENT_OTHER): Payer: 59 | Admitting: Internal Medicine

## 2013-07-25 ENCOUNTER — Other Ambulatory Visit (HOSPITAL_BASED_OUTPATIENT_CLINIC_OR_DEPARTMENT_OTHER): Payer: 59

## 2013-07-25 ENCOUNTER — Other Ambulatory Visit (HOSPITAL_COMMUNITY)
Admission: RE | Admit: 2013-07-25 | Discharge: 2013-07-25 | Disposition: A | Discharge status: Home / Self Care | Payer: 59 | Source: Ambulatory Visit | Attending: Internal Medicine | Admitting: Internal Medicine

## 2013-07-25 ENCOUNTER — Encounter: Payer: Self-pay | Admitting: Internal Medicine

## 2013-07-25 VITALS — BP 113/64 | HR 57 | Temp 98.1°F | Resp 18

## 2013-07-25 DIAGNOSIS — D709 Neutropenia, unspecified: Secondary | ICD-10-CM

## 2013-07-25 DIAGNOSIS — D72819 Decreased white blood cell count, unspecified: Secondary | ICD-10-CM | POA: Insufficient documentation

## 2013-07-25 LAB — CBC WITH DIFFERENTIAL/PLATELET
BASO%: 1.9 % (ref 0.0–2.0)
Basophils Absolute: 0 10*3/uL (ref 0.0–0.1)
EOS%: 3.7 % (ref 0.0–7.0)
Eosinophils Absolute: 0.1 10*3/uL (ref 0.0–0.5)
HEMATOCRIT: 45.7 % (ref 38.4–49.9)
HGB: 15.5 g/dL (ref 13.0–17.1)
LYMPH%: 57.9 % — ABNORMAL HIGH (ref 14.0–49.0)
MCH: 33.3 pg (ref 27.2–33.4)
MCHC: 33.9 g/dL (ref 32.0–36.0)
MCV: 98 fL (ref 79.3–98.0)
MONO#: 0.3 10*3/uL (ref 0.1–0.9)
MONO%: 16.6 % — ABNORMAL HIGH (ref 0.0–14.0)
NEUT#: 0.3 10*3/uL — CL (ref 1.5–6.5)
NEUT%: 19.9 % — AB (ref 39.0–75.0)
Platelets: 178 10*3/uL (ref 140–400)
RBC: 4.66 10*6/uL (ref 4.20–5.82)
RDW: 13.3 % (ref 11.0–14.6)
WBC: 1.7 10*3/uL — ABNORMAL LOW (ref 4.0–10.3)
lymph#: 1 10*3/uL (ref 0.9–3.3)

## 2013-07-25 LAB — COMPREHENSIVE METABOLIC PANEL (CC13)
ALBUMIN: 4.1 g/dL (ref 3.5–5.0)
ALT: 31 U/L (ref 0–55)
ANION GAP: 7 meq/L (ref 3–11)
AST: 32 U/L (ref 5–34)
Alkaline Phosphatase: 62 U/L (ref 40–150)
BUN: 13.3 mg/dL (ref 7.0–26.0)
CALCIUM: 9.4 mg/dL (ref 8.4–10.4)
CHLORIDE: 107 meq/L (ref 98–109)
CO2: 27 meq/L (ref 22–29)
CREATININE: 1.1 mg/dL (ref 0.7–1.3)
Glucose: 98 mg/dl (ref 70–140)
POTASSIUM: 4 meq/L (ref 3.5–5.1)
Sodium: 141 mEq/L (ref 136–145)
Total Bilirubin: 0.76 mg/dL (ref 0.20–1.20)
Total Protein: 7.8 g/dL (ref 6.4–8.3)

## 2013-07-25 LAB — BONE MARROW EXAM

## 2013-07-25 LAB — LACTATE DEHYDROGENASE (CC13): LDH: 147 U/L (ref 125–245)

## 2013-07-25 NOTE — Patient Instructions (Addendum)
[facility] Discharge Instructions for Post Bone Marrow Procedure  Today you had a bone marrow biopsy and aspirate of Right hip Please keep the pressure dressing in place for at least 24 hours.  Have someone check your dressing periodically for bleeding.  If needed you can reapply a pressure dressing to the site.  Take pain medication  as directed.  IF BLEEDING REOCCURS THAT SHOULD BE REPORTED IMMEDIATELY. Call the [facility] at [phone] if during business hours. Or report to the Emergency Room.   I have been informed and understand all the instructions given to me. I know to contact the clinic, my physician, or go to the Emergency Department if any problems should occur. I do not have any questions at this time, but understand that I may call the clinic during office hours at [phone]  should I have any questions or need assistance in obtaining follow up care.    __________________________________________  _____________  __________ Signature of Patient or Authorized Representative            Date                   Time    __________________________________________ Nurse's Signature    Neutropenia Neutropenia is a condition that occurs when the level of a certain type of white blood cell (neutrophil) in your body becomes lower than normal. Neutrophils are made in the bone marrow and fight infections. These cells protect against bacteria and viruses. The fewer neutrophils you have, and the longer your body remains without them, the greater your risk of getting a severe infection becomes. CAUSES  The cause of neutropenia may be hard to determine. However, it is usually due to 3 main problems:   Decreased production of neutrophils. This may be due to:  Certain medicines such as chemotherapy.  Genetic problems.  Cancer.  Radiation treatments.  Vitamin deficiency.  Some pesticides.  Increased destruction of neutrophils. This may be due  to:  Overwhelming infections.  Hemolytic anemia. This is when the body destroys its own blood cells.  Chemotherapy.  Neutrophils moving to areas of the body where they cannot fight infections. This may be due to:  Dialysis procedures.  Conditions where the spleen becomes enlarged. Neutrophils are held in the spleen and are not available to the rest of the body.  Overwhelming infections. The neutrophils are held in the area of the infection and are not available to the rest of the body. SYMPTOMS  There are no specific symptoms of neutropenia. The lack of neutrophils can result in an infection, and an infection can cause various problems. DIAGNOSIS  Diagnosis is made by a blood test. A complete blood count is performed. The normal level of neutrophils in human blood differs with age and race. Infants have lower counts than older children and adults. African Americans have lower counts than Caucasians or Asians. The average adult level is 1500 cells/mm3 of blood. Neutrophil counts are interpreted as follows:  Greater than 1000 cells/mm3 gives normal protection against infection.  500 to 1000 cells/mm3 gives an increased risk for infection.  200 to 500 cells/mm3 is a greater risk for severe infection.  Lower than 200 cells/mm3 is a marked risk of infection. This may require hospitalization and treatment with antibiotic medicines. TREATMENT  Treatment depends on the underlying cause, severity, and presence of infections or symptoms. It also depends on your health. Your caregiver will discuss the treatment plan with you. Mild cases are often easily  treated and have a good outcome. Preventative measures may also be started to limit your risk of infections. Treatment can include:  Taking antibiotics.  Stopping medicines that are known to cause neutropenia.  Correcting nutritional deficiencies by eating green vegetables to supply folic acid and taking vitamin B supplements.  Stopping  exposure to pesticides if your neutropenia is related to pesticide exposure.  Taking a blood growth factor called sargramostim, pegfilgrastim, or filgrastim if you are undergoing chemotherapy for cancer. This stimulates white blood cell production.  Removal of the spleen if you have Felty's syndrome and have repeated infections. HOME CARE INSTRUCTIONS   Follow your caregiver's instructions about when you need to have blood work done.  Wash your hands often. Make sure others who come in contact with you also wash their hands.  Wash raw fruits and vegetables before eating them. They can carry bacteria and fungi.  Avoid people with colds or spreadable (contagious) diseases (chickenpox, herpes zoster, influenza).  Avoid large crowds.  Avoid construction areas. The dust can release fungus into the air.  Be cautious around children in daycare or school environments.  Take care of your respiratory system by coughing and deep breathing.  Bathe daily.  Protect your skin from cuts and burns.  Do not work in the garden or with flowers and plants.  Care for the mouth before and after meals by brushing with a soft toothbrush. If you have mucositis, do not use mouthwash. Mouthwash contains alcohol and can dry out the mouth even more.  Clean the area between the genitals and the anus (perineal area) after urination and bowel movements. Women need to wipe from front to back.  Use a water soluble lubricant during sexual intercourse and practice good hygiene after. Do not have intercourse if you are severely neutropenic. Check with your caregiver for guidelines.  Exercise daily as tolerated.  Avoid people who were vaccinated with a live vaccine in the past 30 days. You should not receive live vaccines (polio, typhoid).  Do not provide direct care for pets. Avoid animal droppings. Do not clean litter boxes and bird cages.  Do not share food utensils.  Do not use tampons, enemas, or rectal  suppositories unless directed by your caregiver.  Use an electric razor to remove hair.  Wash your hands after handling magazines, letters, and newspapers. SEEK IMMEDIATE MEDICAL CARE IF:   You have a fever of 100.5F or higher  You have chills or start to shake.  You feel nauseous or vomit.  You develop mouth sores.  You develop aches and pains.  You have redness and swelling around open wounds.  Your skin is warm to the touch.  You have pus coming from your wounds.  You develop swollen lymph nodes.  You feel weak or fatigued.  You develop red streaks on the skin. MAKE SURE YOU:  Understand these instructions.  Will watch your condition.  Will get help right away if you are not doing well or get worse. Document Released: 12/23/2001 Document Revised: 09/25/2011 Document Reviewed: 01/20/2011 Exit[employer] Patient Information 2014 [employer].  

## 2013-07-25 NOTE — Progress Notes (Signed)
Bone Marrow Biopsy and Aspiration Procedure Note   Informed consent was obtained and potential risks including bleeding, infection and pain were reviewed with the patient.   Posterior iliac crest(s) prepped with Betadine.   Lidocaine 2% local anesthesia infiltrated into the subcutaneous tissue.  Right bone marrow biopsy and right bone marrow aspirate was obtained.   The procedure was tolerated well and there were no complications.  Specimens sent for: routine histopathologic stains and sectioning, flow cytometry and cytogenetics  Physician: MOHAMED,MOHAMED K. 

## 2013-07-25 NOTE — Progress Notes (Signed)
Pressure dressing over Bone marrow site intact with dime size spot of blood.Pt verbalizes understanding to call for any concerns such as increased bleeding at site and temp >100.5 f.  Neutropenic precautions reviewed with pt.  Pt instructed to keep dressing intact x 24 hours.

## 2013-08-05 LAB — CHROMOSOME ANALYSIS, BONE MARROW

## 2013-08-06 ENCOUNTER — Other Ambulatory Visit: Payer: 59

## 2013-08-13 ENCOUNTER — Encounter: Payer: Self-pay | Admitting: Internal Medicine

## 2013-08-13 ENCOUNTER — Other Ambulatory Visit (HOSPITAL_BASED_OUTPATIENT_CLINIC_OR_DEPARTMENT_OTHER): Payer: 59

## 2013-08-13 ENCOUNTER — Ambulatory Visit (HOSPITAL_BASED_OUTPATIENT_CLINIC_OR_DEPARTMENT_OTHER): Payer: 59 | Admitting: Internal Medicine

## 2013-08-13 VITALS — BP 125/65 | HR 76 | Temp 97.8°F | Resp 18 | Ht 72.0 in | Wt 189.7 lb

## 2013-08-13 DIAGNOSIS — D709 Neutropenia, unspecified: Secondary | ICD-10-CM

## 2013-08-13 DIAGNOSIS — D72819 Decreased white blood cell count, unspecified: Secondary | ICD-10-CM

## 2013-08-13 DIAGNOSIS — D72829 Elevated white blood cell count, unspecified: Secondary | ICD-10-CM

## 2013-08-13 LAB — CBC WITH DIFFERENTIAL/PLATELET
BASO%: 1.4 % (ref 0.0–2.0)
BASOS ABS: 0 10*3/uL (ref 0.0–0.1)
EOS%: 3.4 % (ref 0.0–7.0)
Eosinophils Absolute: 0.1 10*3/uL (ref 0.0–0.5)
HEMATOCRIT: 48 % (ref 38.4–49.9)
HGB: 16.3 g/dL (ref 13.0–17.1)
LYMPH#: 1.2 10*3/uL (ref 0.9–3.3)
LYMPH%: 51 % — ABNORMAL HIGH (ref 14.0–49.0)
MCH: 33.5 pg — AB (ref 27.2–33.4)
MCHC: 33.9 g/dL (ref 32.0–36.0)
MCV: 98.8 fL — AB (ref 79.3–98.0)
MONO#: 0.5 10*3/uL (ref 0.1–0.9)
MONO%: 20 % — ABNORMAL HIGH (ref 0.0–14.0)
NEUT#: 0.6 10*3/uL — ABNORMAL LOW (ref 1.5–6.5)
NEUT%: 24.2 % — ABNORMAL LOW (ref 39.0–75.0)
PLATELETS: 203 10*3/uL (ref 140–400)
RBC: 4.86 10*6/uL (ref 4.20–5.82)
RDW: 13.5 % (ref 11.0–14.6)
WBC: 2.4 10*3/uL — ABNORMAL LOW (ref 4.0–10.3)

## 2013-08-13 NOTE — Patient Instructions (Signed)
Followup visit in 3 months with repeat CBC and LDH.

## 2013-08-13 NOTE — Progress Notes (Signed)
[facility] [phone]   [phone]  OFFICE PROGRESS NOTE  [name], MD [address] [place] [address]  DIAGNOSIS: Leukocytopenia and neutropenia of unknown etiology.  PRIOR THERAPY: None  CURRENT THERAPY: None  INTERVAL HISTORY: Charles Cannon 58 y.o. male returns to the clinic today for follow up visit. The patient is feeling fine today with no specific complaints. He had several studies performed recently for evaluation of leukocytopenia and neutropenia. He denied having any significant weight loss or night sweats. He has no nausea or vomiting. The patient denied having any significant chest pain, shortness breath, cough or hemoptysis. He recently underwent a bone marrow biopsy and aspirate and is here for evaluation and discussion of his biopsy results.  MEDICAL HISTORY: Past Medical History  Diagnosis Date  . Sickle cell anemia     has the trait  . Inflammation of shoulder joint     right  . Leukopenia     ALLERGIES:  has No Known Allergies.  MEDICATIONS:  Current Outpatient Prescriptions  Medication Sig Dispense Refill  . CIALIS 20 MG tablet Take 20 mg by mouth daily as needed for erectile dysfunction.       . NON FORMULARY Take 1 packet by mouth daily. G[employer] vital pack- multi-vitamin pack       Current Facility-Administered Medications  Medication Dose Route Frequency Provider Last Rate Last Dose  . 0.9 %  sodium chloride infusion  500 mL Intravenous Continuous Ladene Artist, MD        SURGICAL HISTORY:  Past Surgical History  Procedure Laterality Date  . Tooth implant    . Colonoscopy    . Polypectomy      REVIEW OF SYSTEMS:  Constitutional: negative Eyes: negative Ears, nose, mouth, throat, and face: negative Respiratory: negative Cardiovascular: negative Gastrointestinal: negative Genitourinary:negative Integument/breast: negative Hematologic/lymphatic:  negative Musculoskeletal:negative Neurological: negative Behavioral/Psych: negative Endocrine: negative Allergic/Immunologic: negative   PHYSICAL EXAMINATION: General appearance: alert, cooperative and no distress Head: Normocephalic, without obvious abnormality, atraumatic Neck: no adenopathy, no JVD, supple, symmetrical, trachea midline and thyroid not enlarged, symmetric, no tenderness/mass/nodules Lymph nodes: Cervical, supraclavicular, and axillary nodes normal. Resp: clear to auscultation bilaterally Back: symmetric, no curvature. ROM normal. No CVA tenderness. Cardio: regular rate and rhythm, S1, S2 normal, no murmur, click, rub or gallop GI: soft, non-tender; bowel sounds normal; no masses,  no organomegaly Extremities: extremities normal, atraumatic, no cyanosis or edema Neurologic: Alert and oriented X 3, normal strength and tone. Normal symmetric reflexes. Normal coordination and gait  ECOG PERFORMANCE STATUS: 0 - Asymptomatic  Blood pressure 125/65, pulse 76, temperature 97.8 F (36.6 C), temperature source Oral, resp. rate 18, height 6' (1.829 m), weight 189 lb 11.2 oz (86.047 kg).  LABORATORY DATA: Lab Results  Component Value Date   WBC 2.4* 08/13/2013   HGB 16.3 08/13/2013   HCT 48.0 08/13/2013   MCV 98.8* 08/13/2013   PLT 203 08/13/2013      Chemistry      Component Value Date/Time   NA 141 07/25/2013 0824   NA 133* 07/02/2013 1401   K 4.0 07/25/2013 0824   K 4.2 07/02/2013 1401   CL 98 07/02/2013 1401   CO2 27 07/25/2013 0824   CO2 25 07/02/2013 1401   BUN 13.3 07/25/2013 0824   BUN 11 07/02/2013 1401   CREATININE 1.1 07/25/2013 0824   CREATININE 1.00 07/02/2013 1401      Component Value Date/Time  CALCIUM 9.4 07/25/2013 0824   CALCIUM 9.4 07/02/2013 1401   ALKPHOS 62 07/25/2013 0824   ALKPHOS 48 07/02/2013 1401   AST 32 07/25/2013 0824   AST 38* 07/02/2013 1401   ALT 31 07/25/2013 0824   ALT 32 07/02/2013 1401   BILITOT 0.76 07/25/2013 0824   BILITOT 0.6  07/02/2013 1401       RADIOGRAPHIC STUDIES:  BONE MARROW REPORT FINAL DIAGNOSIS Diagnosis Bone Marrow, Aspirate,Biopsy, and Clot, right superior posterior iliac crest - HYPERCELLULAR BONE MARROW FOR AGE WITH TRILINEAGE HEMATOPOIESIS. - ATYPICAL LYMPHOID AGGREGATE PRESENT. - MILD POLYCLONAL PLASMACYTOSIS (PLASMA CELLS 7%). PERIPHERAL BLOOD: - LEUKOPENIA WITH NEUTROPENIA. Diagnosis Note The bone marrow is hypercellular with trilineage myeloid hematopoiesis including abundant mature neutrophils. No increase in blastic cells is identified and significant dyspoiesis is not present. The core biopsy in particular shows an atypical medium sized lymphoid aggregate mostly composed of small lymphoid cells with a significant component of B-cells lacking CD5, CD10 or Cyclin D-1 expression. Flow cytometric analysis was performed but failed to show any monoclonal B-cell population or abnormal T-cell phenotype. Given the limited morphologic changes, the exact nature of this atypical lymphoid aggregate is uncertain. If significant lymphadenopathy or splenomegaly develops, tissue studies are recommended for lymphoma workup. (BNS:ecj 07/28/2013) (BNS:caf 07/29/13) Susanne Greenhouse MD Pathologist, Electronic Signature (Case signed 07/29/2013)  ASSESSMENT AND PLAN: This is a very pleasant 58 years old [place] American male with persistent leukocytosis and neutropenia of unclear etiology at this point.  His recent bone marrow biopsy and aspirate showed no significant abnormality except for some atypical lymphoid aggregates and mild polyclonal plasmacytosis. I discussed the biopsy results with the patient. I recommended for him to continue on observation for now with repeat CBC and LDH in 3 months. He was advised to call immediately if he has any concerning symptoms in the interval. The patient voices understanding of current disease status and treatment options and is in agreement with the current care  plan.  All questions were answered. The patient knows to call the clinic with any problems, questions or concerns. We can certainly see the patient much sooner if necessary.  I spent 15 minutes counseling the patient face to face. The total time spent in the appointment was 25 minutes.  Disclaimer: This note was dictated with voice recognition software. Similar sounding words can inadvertently be transcribed and may not be corrected upon review.

## 2013-08-15 ENCOUNTER — Telehealth: Payer: Self-pay | Admitting: Internal Medicine

## 2013-08-15 NOTE — Telephone Encounter (Signed)
s.w. pt and advised on April appt...mailed pt appt sched, avs adn letter

## 2013-11-11 ENCOUNTER — Ambulatory Visit: Payer: 59 | Admitting: Internal Medicine

## 2013-11-11 ENCOUNTER — Other Ambulatory Visit: Payer: 59

## 2013-11-12 ENCOUNTER — Encounter: Payer: Self-pay | Admitting: Internal Medicine

## 2013-11-12 ENCOUNTER — Other Ambulatory Visit (HOSPITAL_BASED_OUTPATIENT_CLINIC_OR_DEPARTMENT_OTHER): Payer: 59

## 2013-11-12 ENCOUNTER — Ambulatory Visit (HOSPITAL_BASED_OUTPATIENT_CLINIC_OR_DEPARTMENT_OTHER): Payer: 59 | Admitting: Internal Medicine

## 2013-11-12 VITALS — BP 128/68 | HR 72 | Temp 97.9°F | Resp 19 | Ht 72.0 in | Wt 192.6 lb

## 2013-11-12 DIAGNOSIS — D72819 Decreased white blood cell count, unspecified: Secondary | ICD-10-CM

## 2013-11-12 DIAGNOSIS — D709 Neutropenia, unspecified: Secondary | ICD-10-CM

## 2013-11-12 LAB — CBC WITH DIFFERENTIAL/PLATELET
BASO%: 1.6 % (ref 0.0–2.0)
BASOS ABS: 0 10*3/uL (ref 0.0–0.1)
EOS%: 3.6 % (ref 0.0–7.0)
Eosinophils Absolute: 0.1 10*3/uL (ref 0.0–0.5)
HEMATOCRIT: 48.3 % (ref 38.4–49.9)
HEMOGLOBIN: 16.1 g/dL (ref 13.0–17.1)
LYMPH%: 52.3 % — ABNORMAL HIGH (ref 14.0–49.0)
MCH: 32.8 pg (ref 27.2–33.4)
MCHC: 33.3 g/dL (ref 32.0–36.0)
MCV: 98.3 fL — ABNORMAL HIGH (ref 79.3–98.0)
MONO#: 0.4 10*3/uL (ref 0.1–0.9)
MONO%: 16.2 % — ABNORMAL HIGH (ref 0.0–14.0)
NEUT%: 26.3 % — AB (ref 39.0–75.0)
NEUTROS ABS: 0.6 10*3/uL — AB (ref 1.5–6.5)
PLATELETS: 184 10*3/uL (ref 140–400)
RBC: 4.91 10*6/uL (ref 4.20–5.82)
RDW: 13.5 % (ref 11.0–14.6)
WBC: 2.4 10*3/uL — AB (ref 4.0–10.3)
lymph#: 1.3 10*3/uL (ref 0.9–3.3)

## 2013-11-12 LAB — LACTATE DEHYDROGENASE (CC13): LDH: 167 U/L (ref 125–245)

## 2013-11-12 NOTE — Progress Notes (Signed)
[place] [phone]   [phone]  OFFICE PROGRESS NOTE  [name], MD [address] [place] [address]  DIAGNOSIS: Leukocytopenia and neutropenia of unknown etiology.  PRIOR THERAPY: None  CURRENT THERAPY: None  INTERVAL HISTORY: Charles Cannon 58 y.o. male returns to the clinic today for follow up visit. The patient is feeling fine today with no specific complaints. He has no fever or chills. He denied having any significant weight loss or night sweats. He has no nausea or vomiting. The patient denied having any significant chest pain, shortness of breath, cough or hemoptysis.   MEDICAL HISTORY: Past Medical History  Diagnosis Date  . Sickle cell anemia     has the trait  . Inflammation of shoulder joint     right  . Leukopenia     ALLERGIES:  has No Known Allergies.  MEDICATIONS:  Current Outpatient Prescriptions  Medication Sig Dispense Refill  . CIALIS 20 MG tablet Take 20 mg by mouth daily as needed for erectile dysfunction.       . NON FORMULARY Take 1 packet by mouth daily. [employer] vital pack- multi-vitamin pack       Current Facility-Administered Medications  Medication Dose Route Frequency Provider Last Rate Last Dose  . 0.9 %  sodium chloride infusion  500 mL Intravenous Continuous Ladene Artist, MD        SURGICAL HISTORY:  Past Surgical History  Procedure Laterality Date  . Tooth implant    . Colonoscopy    . Polypectomy      REVIEW OF SYSTEMS:  Constitutional: negative Eyes: negative Ears, nose, mouth, throat, and face: negative Respiratory: negative Cardiovascular: negative Gastrointestinal: negative Genitourinary:negative Integument/breast: negative Hematologic/lymphatic: negative Musculoskeletal:negative Neurological: negative Behavioral/Psych: negative Endocrine: negative Allergic/Immunologic: negative   PHYSICAL EXAMINATION: General appearance: alert, cooperative and  no distress Head: Normocephalic, without obvious abnormality, atraumatic Neck: no adenopathy, no JVD, supple, symmetrical, trachea midline and thyroid not enlarged, symmetric, no tenderness/mass/nodules Lymph nodes: Cervical, supraclavicular, and axillary nodes normal. Resp: clear to auscultation bilaterally Back: symmetric, no curvature. ROM normal. No CVA tenderness. Cardio: regular rate and rhythm, S1, S2 normal, no murmur, click, rub or gallop GI: soft, non-tender; bowel sounds normal; no masses,  no organomegaly Extremities: extremities normal, atraumatic, no cyanosis or edema Neurologic: Alert and oriented X 3, normal strength and tone. Normal symmetric reflexes. Normal coordination and gait  ECOG PERFORMANCE STATUS: 0 - Asymptomatic  Blood pressure 128/68, pulse 72, temperature 97.9 F (36.6 C), temperature source Oral, resp. rate 19, height 6' (1.829 m), weight 192 lb 9.6 oz (87.363 kg).  LABORATORY DATA: Lab Results  Component Value Date   WBC 2.4* 11/12/2013   HGB 16.1 11/12/2013   HCT 48.3 11/12/2013   MCV 98.3* 11/12/2013   PLT 184 11/12/2013      Chemistry      Component Value Date/Time   NA 141 07/25/2013 0824   NA 133* 07/02/2013 1401   K 4.0 07/25/2013 0824   K 4.2 07/02/2013 1401   CL 98 07/02/2013 1401   CO2 27 07/25/2013 0824   CO2 25 07/02/2013 1401   BUN 13.3 07/25/2013 0824   BUN 11 07/02/2013 1401   CREATININE 1.1 07/25/2013 0824   CREATININE 1.00 07/02/2013 1401      Component Value Date/Time   CALCIUM 9.4 07/25/2013 0824   CALCIUM 9.4 07/02/2013 1401   ALKPHOS 62 07/25/2013 0824   ALKPHOS 48 07/02/2013 1401  AST 32 07/25/2013 0824   AST 38* 07/02/2013 1401   ALT 31 07/25/2013 0824   ALT 32 07/02/2013 1401   BILITOT 0.76 07/25/2013 0824   BILITOT 0.6 07/02/2013 1401       RADIOGRAPHIC STUDIES:   ASSESSMENT AND PLAN: This is a very pleasant 58 years old [place] American male with persistent leukocytosis and neutropenia of unclear etiology at this point.  His  previous bone marrow biopsy and aspirate showed no significant abnormality except for some atypical lymphoid aggregates and mild polyclonal plasmacytosis. His CBC today showed persistent leukocytopenia and neutropenia but not worse than last time I recommended for him to continue on observation for now with repeat CBC and LDH in 6 months. He was advised to call immediately if he has any concerning symptoms in the interval. The patient voices understanding of current disease status and treatment options and is in agreement with the current care plan.  All questions were answered. The patient knows to call the clinic with any problems, questions or concerns. We can certainly see the patient much sooner if necessary.  Disclaimer: This note was dictated with voice recognition software. Similar sounding words can inadvertently be transcribed and may not be corrected upon review.

## 2013-11-14 ENCOUNTER — Telehealth: Payer: Self-pay | Admitting: Internal Medicine

## 2013-11-14 NOTE — Telephone Encounter (Signed)
, °

## 2013-12-26 ENCOUNTER — Encounter (HOSPITAL_COMMUNITY): Payer: Self-pay

## 2014-05-18 ENCOUNTER — Ambulatory Visit (HOSPITAL_BASED_OUTPATIENT_CLINIC_OR_DEPARTMENT_OTHER): Payer: 59 | Admitting: Internal Medicine

## 2014-05-18 ENCOUNTER — Telehealth: Payer: Self-pay | Admitting: Internal Medicine

## 2014-05-18 ENCOUNTER — Encounter: Payer: Self-pay | Admitting: Internal Medicine

## 2014-05-18 ENCOUNTER — Other Ambulatory Visit (HOSPITAL_BASED_OUTPATIENT_CLINIC_OR_DEPARTMENT_OTHER): Payer: 59

## 2014-05-18 VITALS — BP 125/71 | HR 61 | Temp 98.4°F | Resp 18 | Ht 72.0 in | Wt 196.1 lb

## 2014-05-18 DIAGNOSIS — D709 Neutropenia, unspecified: Secondary | ICD-10-CM

## 2014-05-18 DIAGNOSIS — D72819 Decreased white blood cell count, unspecified: Secondary | ICD-10-CM

## 2014-05-18 DIAGNOSIS — Z23 Encounter for immunization: Secondary | ICD-10-CM

## 2014-05-18 LAB — CBC WITH DIFFERENTIAL/PLATELET
BASO%: 1.1 % (ref 0.0–2.0)
Basophils Absolute: 0 10*3/uL (ref 0.0–0.1)
EOS%: 2.2 % (ref 0.0–7.0)
Eosinophils Absolute: 0 10*3/uL (ref 0.0–0.5)
HEMATOCRIT: 48.8 % (ref 38.4–49.9)
HGB: 16 g/dL (ref 13.0–17.1)
LYMPH%: 49 % (ref 14.0–49.0)
MCH: 32.4 pg (ref 27.2–33.4)
MCHC: 32.9 g/dL (ref 32.0–36.0)
MCV: 98.5 fL — ABNORMAL HIGH (ref 79.3–98.0)
MONO#: 0.5 10*3/uL (ref 0.1–0.9)
MONO%: 21.5 % — ABNORMAL HIGH (ref 0.0–14.0)
NEUT#: 0.6 10*3/uL — ABNORMAL LOW (ref 1.5–6.5)
NEUT%: 26.2 % — AB (ref 39.0–75.0)
Platelets: 215 10*3/uL (ref 140–400)
RBC: 4.96 10*6/uL (ref 4.20–5.82)
RDW: 13.7 % (ref 11.0–14.6)
WBC: 2.1 10*3/uL — AB (ref 4.0–10.3)
lymph#: 1 10*3/uL (ref 0.9–3.3)

## 2014-05-18 LAB — LACTATE DEHYDROGENASE (CC13): LDH: 155 U/L (ref 125–245)

## 2014-05-18 MED ORDER — INFLUENZA VAC SPLIT QUAD 0.5 ML IM SUSY
0.5000 mL | PREFILLED_SYRINGE | Freq: Once | INTRAMUSCULAR | Status: AC
  Administered 2014-05-18: 0.5 mL via INTRAMUSCULAR
  Filled 2014-05-18: qty 0.5

## 2014-05-18 NOTE — Patient Instructions (Signed)
Avian Influenza Viruses Avian influenza or "bird flu" is also known as the H5N1 virus. It occurs naturally in wild and domestic birds. Bird flu is easily spread (contagious) among birds and is deadly to them. Though rare, bird flu can cause disease in humans.  CAUSES  Infected birds can shed the H5N1 virus through:   Feces.  Nasal secretions.  Saliva. Birds become infected when they come into contact with infected birds or contaminated surfaces. The bird flu virus is spread from country to country through international poultry trade or by migrating birds.  MODES OF TRANSMISSION TO HUMANS The bird flu virus does not normally infect humans. However, the virus can infect humans who have contact with infected birds, breathe in dust or touch surfaces contaminated with the virus. Human-to-human transmission of the H5N1 virus has been rare. The virus lacks the ability to grow itself (replicate) in humans. However, because all influenza viruses can mutate, scientists are concerned the H5N1 virus will someday replicate itself and make human-to-human transmission easier. If this happens, an influenza "pandemic" could occur.  SYMPTOMS   Symptoms of H5N1 virus are similar to other influenza viruses:  Fever.  Cough.  Sore throat.  Nausea and vomiting.  Diarrhea.  Muscle aches.  Tiredness (malaise).  Some people may get inflammation or redness of the eyes (conjunctivitis).  Life-threatening complications may result in the death of the patient. These include:  Viral pneumonia.  Breathing (respiratory) distress syndrome.  Multi-organ failure. DIAGNOSIS   A person with a respiratory illness may be suffering from bird flu if direct or indirect contact has been made with infected birds. This includes handling or taking care of sick birds. The H5N1 virus may also be suspected if a person has breathed in particles or touched surfaces contaminated with the virus.  In addition to the above  symptoms, a chest X-ray is useful to detect pneumonia.  Fluid specimens such as a sputum sample may be sent to a laboratory for further investigation.  Blood tests may be done to help detect the H5N1 virus. TREATMENT   The H5N1 virus has shown resistance to amantadine and rimantadine, which are two antiviral drugs commonly used for other influenza viruses. However, two other antivirals, oseltamivir and zanamivir, seem to be effective against the H5N1 strain.  If bird flu is suspected in a person, treatment should start immediately without waiting for laboratory confirmation.  Treatment for the H5N1 strain is essentially the same as treating other influenza viruses. PREVENTION   Stay home from work, school, and errands when you are sick. Not being in contact with other people will help stop the spread of illness.  Cover your mouth and nose with your arm when coughing or sneezing. This may help keep those around you from getting sick.  Wash your hands often with warm water and soap. Illnesses are often spread when a person touches something that is contaminated with germs and then touches his or her eyes, nose, or mouth.  Antiviral medications can help prevent the flu.  For optimal health, get plenty of rest, eat a healthy diet, and exercise. Document Released: 07/06/2003 Document Revised: 11/17/2013 Document Reviewed: 01/30/2008 Exit[employer] Patient Information 2015 [employer]. This information is not intended to replace advice given to you by your health care provider. Make sure you discuss any questions you have with your health care provider.  

## 2014-05-18 NOTE — Progress Notes (Signed)
[place] [phone]   Fax:([phone]  OFFICE PROGRESS NOTE  [name], MD [address] [place] [address]  DIAGNOSIS: Leukocytopenia and neutropenia of unknown etiology.  PRIOR THERAPY: None  CURRENT THERAPY: None  INTERVAL HISTORY: Charles Cannon 58 y.o. male returns to the clinic today for follow up visit. The patient is feeling fine today with no specific complaints. He has no fever or chills. He denied having any significant weight loss or night sweats. He has no nausea or vomiting. The patient denied having any significant chest pain, shortness of breath, cough or hemoptysis. He is very active and works full-time. He had repeat CBC performed earlier today and he is here for evaluation and discussion of his lab results.  MEDICAL HISTORY: Past Medical History  Diagnosis Date  . Sickle cell anemia     has the trait  . Inflammation of shoulder joint     right  . Leukopenia     ALLERGIES:  has No Known Allergies.  MEDICATIONS:  Current Outpatient Prescriptions  Medication Sig Dispense Refill  . CIALIS 20 MG tablet Take 20 mg by mouth daily as needed for erectile dysfunction.      No current facility-administered medications for this visit.    SURGICAL HISTORY:  Past Surgical History  Procedure Laterality Date  . Tooth implant    . Colonoscopy    . Polypectomy      REVIEW OF SYSTEMS:  Constitutional: negative Eyes: negative Ears, nose, mouth, throat, and face: negative Respiratory: negative Cardiovascular: negative Gastrointestinal: negative Genitourinary:negative Integument/breast: negative Hematologic/lymphatic: negative Musculoskeletal:negative Neurological: negative Behavioral/Psych: negative Endocrine: negative Allergic/Immunologic: negative   PHYSICAL EXAMINATION: General appearance: alert, cooperative and no distress Head: Normocephalic, without obvious abnormality,  atraumatic Neck: no adenopathy, no JVD, supple, symmetrical, trachea midline and thyroid not enlarged, symmetric, no tenderness/mass/nodules Lymph nodes: Cervical, supraclavicular, and axillary nodes normal. Resp: clear to auscultation bilaterally Back: symmetric, no curvature. ROM normal. No CVA tenderness. Cardio: regular rate and rhythm, S1, S2 normal, no murmur, click, rub or gallop GI: soft, non-tender; bowel sounds normal; no masses,  no organomegaly Extremities: extremities normal, atraumatic, no cyanosis or edema Neurologic: Alert and oriented X 3, normal strength and tone. Normal symmetric reflexes. Normal coordination and gait  ECOG PERFORMANCE STATUS: 0 - Asymptomatic  Blood pressure 125/71, pulse 61, temperature 98.4 F (36.9 C), temperature source Oral, resp. rate 18, height 6' (1.829 m), weight 196 lb 1.6 oz (88.95 kg), SpO2 100 %.  LABORATORY DATA: Lab Results  Component Value Date   WBC 2.1* 05/18/2014   HGB 16.0 05/18/2014   HCT 48.8 05/18/2014   MCV 98.5* 05/18/2014   PLT 215 05/18/2014      Chemistry      Component Value Date/Time   NA 141 07/25/2013 0824   NA 133* 07/02/2013 1401   K 4.0 07/25/2013 0824   K 4.2 07/02/2013 1401   CL 98 07/02/2013 1401   CO2 27 07/25/2013 0824   CO2 25 07/02/2013 1401   BUN 13.3 07/25/2013 0824   BUN 11 07/02/2013 1401   CREATININE 1.1 07/25/2013 0824   CREATININE 1.00 07/02/2013 1401      Component Value Date/Time   CALCIUM 9.4 07/25/2013 0824   CALCIUM 9.4 07/02/2013 1401   ALKPHOS 62 07/25/2013 0824   ALKPHOS 48 07/02/2013 1401   AST 32 07/25/2013 0824   AST 38* 07/02/2013 1401   ALT 31 07/25/2013 0824   ALT 32  07/02/2013 1401   BILITOT 0.76 07/25/2013 0824   BILITOT 0.6 07/02/2013 1401       RADIOGRAPHIC STUDIES:  ASSESSMENT AND PLAN: This is a very pleasant 58 years old [place] American male with persistent leukocytosis and neutropenia of unclear etiology at this point.  His previous bone marrow biopsy  and aspirate showed no significant abnormality except for some atypical lymphoid aggregates and mild polyclonal plasmacytosis. He continues to have persistent leukocytopenia and neutropenia but no worsening of his condition. I discussed the lab result with the patient.  I recommended for him to continue on observation for now with repeat CBC and LDH in 6 months. He was advised to call immediately if he has any concerning symptoms in the interval. The patient voices understanding of current disease status and treatment options and is in agreement with the current care plan.  All questions were answered. The patient knows to call the clinic with any problems, questions or concerns. We can certainly see the patient much sooner if necessary.  Disclaimer: This note was dictated with voice recognition software. Similar sounding words can inadvertently be transcribed and may not be corrected upon review.

## 2014-05-18 NOTE — Telephone Encounter (Signed)
Gave avs & cal for May 2016. °

## 2014-11-16 ENCOUNTER — Other Ambulatory Visit: Payer: 59

## 2014-11-16 ENCOUNTER — Ambulatory Visit: Payer: 59 | Admitting: Internal Medicine

## 2014-11-20 ENCOUNTER — Telehealth: Payer: Self-pay | Admitting: Gastroenterology

## 2014-11-20 NOTE — Telephone Encounter (Signed)
Patient with a 2 month history of LLQ abdominal pain and diarrhea after meals.  He will come in and see Tye Savoy RNP 11/30/14 2:00

## 2014-12-02 ENCOUNTER — Encounter: Payer: Self-pay | Admitting: Nurse Practitioner

## 2014-12-02 ENCOUNTER — Ambulatory Visit (INDEPENDENT_AMBULATORY_CARE_PROVIDER_SITE_OTHER): Payer: 59 | Admitting: Nurse Practitioner

## 2014-12-02 ENCOUNTER — Other Ambulatory Visit (INDEPENDENT_AMBULATORY_CARE_PROVIDER_SITE_OTHER): Payer: 59

## 2014-12-02 VITALS — BP 136/82 | HR 68 | Ht 71.75 in | Wt 197.5 lb

## 2014-12-02 DIAGNOSIS — R1032 Left lower quadrant pain: Secondary | ICD-10-CM | POA: Diagnosis not present

## 2014-12-02 DIAGNOSIS — R194 Change in bowel habit: Secondary | ICD-10-CM

## 2014-12-02 LAB — IGA: IgA: 298 mg/dL (ref 68–378)

## 2014-12-02 LAB — TSH: TSH: 1.19 u[IU]/mL (ref 0.35–4.50)

## 2014-12-02 MED ORDER — DICYCLOMINE HCL 20 MG PO TABS: ORAL_TABLET | ORAL | Status: DC

## 2014-12-02 NOTE — Patient Instructions (Signed)
Your physician has requested that you go to the basement for lab work before leaving today  We have sent the following medications to your pharmacy for you to pick up at your convenience:  Bentyl  Discontinue artificial sweeteners

## 2014-12-02 NOTE — Progress Notes (Signed)
    HPI :  Patient is 59 year old male known to Dr. Fuller Plan. He had a normal screening colonoscopy April 2013. Patient is worked in today for evaluation of postprandial abdominal pain and diarrhea.  Historically patient has one formed stool daily or every other day. Approximately 3 months ago he began having postprandial loose stools as well as dull constant discomfort in his left lower quadrant. Bowel changes are diet dependent in that meat and potatoes are the only things which don't cause postprandial diarrhea. Patient eliminated Pepsi from his diet. He does use artificial sweeteners 3 times a week or so. Dairy nor wheat products cause any more problems then other food items. No nocturnal diarrhea. No antibiotics since January. No medication changes. No unusual weight loss.   Past Medical History  Diagnosis Date  . Sickle cell anemia     has the trait  . Inflammation of shoulder joint     right  . Leukopenia   . Hyperplastic colon polyp   . Diverticulosis     Family History  Problem Relation Age of Onset  . Colon cancer Neg Hx   . Esophageal cancer Neg Hx   . Rectal cancer Neg Hx   . Diabetes Brother   . Heart disease Brother   . Stomach cancer Maternal Aunt   . Hypertension Brother   . Hypertension Sister   . Hypertension Mother   . GER disease Mother   . Dementia Mother   . Stroke Mother   . Seizures Mother    History  Substance Use Topics  . Smoking status: Never Smoker   . Smokeless tobacco: Never Used  . Alcohol Use: 3.6 oz/week    6 Cans of beer per week   Current Outpatient Prescriptions  Medication Sig Dispense Refill  . CIALIS 20 MG tablet Take 20 mg by mouth daily as needed for erectile dysfunction.     . Multiple Vitamins-Minerals (MEGA MULTI MEN PO) Take 1 tablet by mouth daily.     No current facility-administered medications for this visit.   No Known Allergies   Review of Systems: All systems reviewed and negative except where noted in HPI.    Physical Exam: BP 136/82 mmHg  Pulse 68  Ht 5' 11.75" (1.822 m)  Wt 197 lb 8 oz (89.585 kg)  BMI 26.99 kg/m2 Constitutional: Pleasant,well-developed, male in no acute distress. HEENT: Normocephalic and atraumatic. Conjunctivae are normal. No scleral icterus. Neck supple.  Cardiovascular: Normal rate, regular rhythm.  Pulmonary/chest: Effort normal and breath sounds normal. No wheezing, rales or rhonchi. Abdominal: Soft, nondistended, nontender. Bowel sounds active throughout. There are no masses palpable. No hepatomegaly. Extremities: no edema Lymphadenopathy: No cervical adenopathy noted. Neurological: Alert and oriented to person place and time. Skin: Skin is warm and dry. No rashes noted. Psychiatric: Normal mood and affect. Behavior is normal.   ASSESSMENT AND PLAN:  37. 59 year old male with three-month history of bowel changes and left lower quadrant discomfort.. Patient has postprandial loose stool with everything he eats except for meat and potatoes.  Etiology of bowel changes unclear. Doubt infectious given relationship between the postprandial diarrhea and diet but will check stool studies for precautionary measures. I've asked him to refrain from artificial sweeteners for the time being. He will keep a food diary. Trial of Bentyl for abdominal discomfort. Will call patient in 7-10 days with stool study results and to get a condition update

## 2014-12-03 LAB — TISSUE TRANSGLUTAMINASE, IGA: Tissue Transglutaminase Ab, IgA: 1 U/mL (ref <4)

## 2014-12-03 LAB — CLOSTRIDIUM DIFFICILE BY PCR: Toxigenic C. Difficile by PCR: NOT DETECTED

## 2014-12-03 LAB — OVA AND PARASITE EXAMINATION: OP: NONE SEEN

## 2014-12-04 DIAGNOSIS — R1032 Left lower quadrant pain: Secondary | ICD-10-CM | POA: Insufficient documentation

## 2014-12-04 DIAGNOSIS — R194 Change in bowel habit: Secondary | ICD-10-CM | POA: Insufficient documentation

## 2014-12-04 LAB — FECAL LACTOFERRIN, QUANT: Lactoferrin: NEGATIVE

## 2014-12-04 NOTE — Addendum Note (Signed)
Addended by: Willia Craze on: 12/04/2014 11:44 AM   Modules accepted: Level of Service

## 2014-12-04 NOTE — Progress Notes (Signed)
Reviewed and agree with management plan.  Malcolm T. Stark, MD FACG 

## 2016-07-18 DIAGNOSIS — M9902 Segmental and somatic dysfunction of thoracic region: Secondary | ICD-10-CM | POA: Diagnosis not present

## 2016-07-18 DIAGNOSIS — S161XXA Strain of muscle, fascia and tendon at neck level, initial encounter: Secondary | ICD-10-CM | POA: Diagnosis not present

## 2016-07-18 DIAGNOSIS — M9901 Segmental and somatic dysfunction of cervical region: Secondary | ICD-10-CM | POA: Diagnosis not present

## 2016-07-19 DIAGNOSIS — M9901 Segmental and somatic dysfunction of cervical region: Secondary | ICD-10-CM | POA: Diagnosis not present

## 2016-07-19 DIAGNOSIS — S161XXA Strain of muscle, fascia and tendon at neck level, initial encounter: Secondary | ICD-10-CM | POA: Diagnosis not present

## 2016-07-19 DIAGNOSIS — M9902 Segmental and somatic dysfunction of thoracic region: Secondary | ICD-10-CM | POA: Diagnosis not present

## 2016-07-25 DIAGNOSIS — M9902 Segmental and somatic dysfunction of thoracic region: Secondary | ICD-10-CM | POA: Diagnosis not present

## 2016-07-25 DIAGNOSIS — S161XXA Strain of muscle, fascia and tendon at neck level, initial encounter: Secondary | ICD-10-CM | POA: Diagnosis not present

## 2016-07-25 DIAGNOSIS — M9901 Segmental and somatic dysfunction of cervical region: Secondary | ICD-10-CM | POA: Diagnosis not present

## 2016-08-04 DIAGNOSIS — M9902 Segmental and somatic dysfunction of thoracic region: Secondary | ICD-10-CM | POA: Diagnosis not present

## 2016-08-04 DIAGNOSIS — M9901 Segmental and somatic dysfunction of cervical region: Secondary | ICD-10-CM | POA: Diagnosis not present

## 2016-08-04 DIAGNOSIS — S161XXA Strain of muscle, fascia and tendon at neck level, initial encounter: Secondary | ICD-10-CM | POA: Diagnosis not present

## 2016-08-08 DIAGNOSIS — S161XXA Strain of muscle, fascia and tendon at neck level, initial encounter: Secondary | ICD-10-CM | POA: Diagnosis not present

## 2016-08-08 DIAGNOSIS — M9901 Segmental and somatic dysfunction of cervical region: Secondary | ICD-10-CM | POA: Diagnosis not present

## 2016-08-08 DIAGNOSIS — M9902 Segmental and somatic dysfunction of thoracic region: Secondary | ICD-10-CM | POA: Diagnosis not present

## 2016-08-16 DIAGNOSIS — Z0000 Encounter for general adult medical examination without abnormal findings: Secondary | ICD-10-CM | POA: Diagnosis not present

## 2016-08-16 DIAGNOSIS — Z23 Encounter for immunization: Secondary | ICD-10-CM | POA: Diagnosis not present

## 2016-11-17 DIAGNOSIS — R1032 Left lower quadrant pain: Secondary | ICD-10-CM | POA: Diagnosis not present

## 2017-06-05 DIAGNOSIS — J019 Acute sinusitis, unspecified: Secondary | ICD-10-CM | POA: Diagnosis not present

## 2017-06-18 DIAGNOSIS — N401 Enlarged prostate with lower urinary tract symptoms: Secondary | ICD-10-CM | POA: Diagnosis not present

## 2017-06-18 DIAGNOSIS — R3912 Poor urinary stream: Secondary | ICD-10-CM | POA: Diagnosis not present

## 2017-08-01 DIAGNOSIS — Z0100 Encounter for examination of eyes and vision without abnormal findings: Secondary | ICD-10-CM | POA: Diagnosis not present

## 2017-08-21 DIAGNOSIS — Z0000 Encounter for general adult medical examination without abnormal findings: Secondary | ICD-10-CM | POA: Diagnosis not present

## 2017-08-21 DIAGNOSIS — Z13220 Encounter for screening for lipoid disorders: Secondary | ICD-10-CM | POA: Diagnosis not present

## 2017-09-14 DIAGNOSIS — R748 Abnormal levels of other serum enzymes: Secondary | ICD-10-CM | POA: Diagnosis not present

## 2017-11-15 ENCOUNTER — Other Ambulatory Visit: Payer: Self-pay

## 2017-11-15 ENCOUNTER — Emergency Department (HOSPITAL_COMMUNITY)
Admission: EM | Admit: 2017-11-15 | Discharge: 2017-11-15 | Disposition: A | Discharge status: Home / Self Care | Payer: No Typology Code available for payment source | Attending: Emergency Medicine | Admitting: Emergency Medicine

## 2017-11-15 ENCOUNTER — Emergency Department (HOSPITAL_COMMUNITY): Payer: No Typology Code available for payment source

## 2017-11-15 ENCOUNTER — Encounter (HOSPITAL_COMMUNITY): Payer: Self-pay

## 2017-11-15 DIAGNOSIS — R079 Chest pain, unspecified: Secondary | ICD-10-CM | POA: Diagnosis not present

## 2017-11-15 DIAGNOSIS — S299XXA Unspecified injury of thorax, initial encounter: Secondary | ICD-10-CM | POA: Diagnosis not present

## 2017-11-15 DIAGNOSIS — Y939 Activity, unspecified: Secondary | ICD-10-CM | POA: Insufficient documentation

## 2017-11-15 DIAGNOSIS — R0789 Other chest pain: Secondary | ICD-10-CM | POA: Diagnosis not present

## 2017-11-15 DIAGNOSIS — M25512 Pain in left shoulder: Secondary | ICD-10-CM | POA: Diagnosis not present

## 2017-11-15 DIAGNOSIS — Z79899 Other long term (current) drug therapy: Secondary | ICD-10-CM | POA: Insufficient documentation

## 2017-11-15 DIAGNOSIS — Y999 Unspecified external cause status: Secondary | ICD-10-CM | POA: Diagnosis not present

## 2017-11-15 DIAGNOSIS — Y929 Unspecified place or not applicable: Secondary | ICD-10-CM | POA: Diagnosis not present

## 2017-11-15 MED ORDER — IBUPROFEN 400 MG PO TABS
600.0000 mg | ORAL_TABLET | Freq: Once | ORAL | Status: DC
  Filled 2017-11-15: qty 1

## 2017-11-15 MED ORDER — METHOCARBAMOL 500 MG PO TABS: 500.0000 mg | ORAL_TABLET | Freq: Two times a day (BID) | ORAL | 0 refills | Status: DC
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
## 2017-11-15 NOTE — ED Notes (Signed)
Found patient with family member in waiting room. Provider notified.
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
## 2017-11-15 NOTE — ED Notes (Signed)
Patient still in xray  
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
## 2017-11-15 NOTE — ED Notes (Signed)
Patient states does not want pain medication at this time.

## 2017-11-15 NOTE — ED Notes (Signed)
Patient has not returned from xray. Called xray and stated transported patient back to room after xrays completed. Patient not in room or bathroom. Provider notified.

## 2017-11-15 NOTE — ED Notes (Signed)
Patient transported to X-ray 

## 2017-11-19 DIAGNOSIS — S161XXA Strain of muscle, fascia and tendon at neck level, initial encounter: Secondary | ICD-10-CM | POA: Diagnosis not present

## 2017-11-19 DIAGNOSIS — M9902 Segmental and somatic dysfunction of thoracic region: Secondary | ICD-10-CM | POA: Diagnosis not present

## 2017-11-19 DIAGNOSIS — M9901 Segmental and somatic dysfunction of cervical region: Secondary | ICD-10-CM | POA: Diagnosis not present

## 2017-11-27 DIAGNOSIS — S161XXA Strain of muscle, fascia and tendon at neck level, initial encounter: Secondary | ICD-10-CM | POA: Diagnosis not present

## 2017-11-27 DIAGNOSIS — M9902 Segmental and somatic dysfunction of thoracic region: Secondary | ICD-10-CM | POA: Diagnosis not present

## 2017-11-27 DIAGNOSIS — M9901 Segmental and somatic dysfunction of cervical region: Secondary | ICD-10-CM | POA: Diagnosis not present

## 2017-12-04 DIAGNOSIS — S161XXA Strain of muscle, fascia and tendon at neck level, initial encounter: Secondary | ICD-10-CM | POA: Diagnosis not present

## 2017-12-04 DIAGNOSIS — M9901 Segmental and somatic dysfunction of cervical region: Secondary | ICD-10-CM | POA: Diagnosis not present

## 2017-12-04 DIAGNOSIS — M9902 Segmental and somatic dysfunction of thoracic region: Secondary | ICD-10-CM | POA: Diagnosis not present

## 2017-12-11 DIAGNOSIS — M9902 Segmental and somatic dysfunction of thoracic region: Secondary | ICD-10-CM | POA: Diagnosis not present

## 2017-12-11 DIAGNOSIS — M9901 Segmental and somatic dysfunction of cervical region: Secondary | ICD-10-CM | POA: Diagnosis not present

## 2017-12-11 DIAGNOSIS — S161XXA Strain of muscle, fascia and tendon at neck level, initial encounter: Secondary | ICD-10-CM | POA: Diagnosis not present

## 2017-12-17 DIAGNOSIS — S161XXA Strain of muscle, fascia and tendon at neck level, initial encounter: Secondary | ICD-10-CM | POA: Diagnosis not present

## 2017-12-17 DIAGNOSIS — M9902 Segmental and somatic dysfunction of thoracic region: Secondary | ICD-10-CM | POA: Diagnosis not present

## 2017-12-17 DIAGNOSIS — M9901 Segmental and somatic dysfunction of cervical region: Secondary | ICD-10-CM | POA: Diagnosis not present

## 2017-12-29 DIAGNOSIS — S161XXA Strain of muscle, fascia and tendon at neck level, initial encounter: Secondary | ICD-10-CM | POA: Diagnosis not present

## 2017-12-29 DIAGNOSIS — M9901 Segmental and somatic dysfunction of cervical region: Secondary | ICD-10-CM | POA: Diagnosis not present

## 2017-12-29 DIAGNOSIS — M9902 Segmental and somatic dysfunction of thoracic region: Secondary | ICD-10-CM | POA: Diagnosis not present

## 2018-06-19 DIAGNOSIS — R3912 Poor urinary stream: Secondary | ICD-10-CM | POA: Diagnosis not present

## 2018-06-19 DIAGNOSIS — Z125 Encounter for screening for malignant neoplasm of prostate: Secondary | ICD-10-CM | POA: Diagnosis not present

## 2018-06-19 DIAGNOSIS — N401 Enlarged prostate with lower urinary tract symptoms: Secondary | ICD-10-CM | POA: Diagnosis not present

## 2018-09-27 DIAGNOSIS — D72819 Decreased white blood cell count, unspecified: Secondary | ICD-10-CM | POA: Diagnosis not present

## 2018-09-27 DIAGNOSIS — Z13220 Encounter for screening for lipoid disorders: Secondary | ICD-10-CM | POA: Diagnosis not present

## 2018-09-27 DIAGNOSIS — Z125 Encounter for screening for malignant neoplasm of prostate: Secondary | ICD-10-CM | POA: Diagnosis not present

## 2018-09-27 DIAGNOSIS — Z0000 Encounter for general adult medical examination without abnormal findings: Secondary | ICD-10-CM | POA: Diagnosis not present

## 2019-01-31 ENCOUNTER — Other Ambulatory Visit: Payer: Self-pay | Admitting: Internal Medicine

## 2019-01-31 DIAGNOSIS — Z20822 Contact with and (suspected) exposure to covid-19: Secondary | ICD-10-CM

## 2019-02-05 LAB — NOVEL CORONAVIRUS, NAA: SARS-CoV-2, NAA: NOT DETECTED

## 2019-02-11 NOTE — Progress Notes (Signed)
I called pt to let him know his COVID-19 test result however the lady that answered the phone said his doctor had called and told him the result.

## 2019-08-31 ENCOUNTER — Ambulatory Visit: Payer: Self-pay | Attending: Internal Medicine

## 2019-08-31 DIAGNOSIS — Z23 Encounter for immunization: Secondary | ICD-10-CM | POA: Insufficient documentation

## 2019-08-31 NOTE — Progress Notes (Signed)
   Covid-19 Vaccination Clinic  Name:  Rae Pivirotto    MRN: [id number] DOB: 04/10/1956  08/31/2019  Mr. Kenaston was observed post Covid-19 immunization for 15 minutes without incidence. He was provided with Vaccine Information Sheet and instruction to access the V-Safe system.   Mr. Sherod was instructed to call 911 with any severe reactions post vaccine: Marland Kitchen Difficulty breathing  . Swelling of your face and throat  . A fast heartbeat  . A bad rash all over your body  . Dizziness and weakness    Immunizations Administered    Name Date Dose VIS Date Route   Pfizer COVID-19 Vaccine 08/31/2019  8:44 AM 0.3 mL 06/27/2019 Intramuscular   Manufacturer: [place]   Lot: [id number]   [place]: [id number]

## 2019-09-22 ENCOUNTER — Ambulatory Visit: Payer: Self-pay | Attending: Internal Medicine

## 2019-09-22 DIAGNOSIS — Z23 Encounter for immunization: Secondary | ICD-10-CM | POA: Insufficient documentation

## 2019-09-22 NOTE — Progress Notes (Signed)
   Covid-19 Vaccination Clinic  Name:  Elford Hissam    MRN: [id number] DOB: 1955-11-27  09/22/2019  Mr. Cissell was observed post Covid-19 immunization for 15 minutes without incident. He was provided with Vaccine Information Sheet and instruction to access the V-Safe system.   Mr. Crookshanks was instructed to call 911 with any severe reactions post vaccine: Marland Kitchen Difficulty breathing  . Swelling of face and throat  . A fast heartbeat  . A bad rash all over body  . Dizziness and weakness   Immunizations Administered    Name Date Dose VIS Date Route   Pfizer COVID-19 Vaccine 09/22/2019  5:10 PM 0.3 mL 06/27/2019 Intramuscular   Manufacturer: [place]   Lot: [id number]   [place]: [id number]
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
## 2020-05-01 ENCOUNTER — Ambulatory Visit: Payer: No Typology Code available for payment source | Attending: Internal Medicine

## 2020-05-01 DIAGNOSIS — Z23 Encounter for immunization: Secondary | ICD-10-CM

## 2020-05-01 NOTE — Progress Notes (Signed)
   Covid-19 Vaccination Clinic  Name:  Charles Cannon    MRN: [id number] DOB: 08-21-1955  05/01/2020  [name] was observed post Covid-19 immunization for 15 minutes without incident. He was provided with Vaccine Information Sheet and instruction to access the V-Safe system.   [name] was instructed to call 911 with any severe reactions post vaccine: Marland Kitchen Difficulty breathing  . Swelling of face and throat  . A fast heartbeat  . A bad rash all over body  . Dizziness and weakness

## 2020-09-16 DIAGNOSIS — R8271 Bacteriuria: Secondary | ICD-10-CM | POA: Diagnosis not present

## 2020-10-12 DIAGNOSIS — R109 Unspecified abdominal pain: Secondary | ICD-10-CM | POA: Diagnosis not present

## 2020-10-23 DIAGNOSIS — R0981 Nasal congestion: Secondary | ICD-10-CM | POA: Diagnosis not present

## 2020-12-17 DIAGNOSIS — D72819 Decreased white blood cell count, unspecified: Secondary | ICD-10-CM | POA: Diagnosis not present

## 2020-12-17 DIAGNOSIS — Z1389 Encounter for screening for other disorder: Secondary | ICD-10-CM | POA: Diagnosis not present

## 2020-12-17 DIAGNOSIS — K5730 Diverticulosis of large intestine without perforation or abscess without bleeding: Secondary | ICD-10-CM | POA: Diagnosis not present

## 2020-12-17 DIAGNOSIS — E7800 Pure hypercholesterolemia, unspecified: Secondary | ICD-10-CM | POA: Diagnosis not present

## 2020-12-17 DIAGNOSIS — Z0000 Encounter for general adult medical examination without abnormal findings: Secondary | ICD-10-CM | POA: Diagnosis not present

## 2021-01-19 DIAGNOSIS — H2513 Age-related nuclear cataract, bilateral: Secondary | ICD-10-CM | POA: Diagnosis not present

## 2021-02-28 ENCOUNTER — Encounter: Payer: Self-pay | Admitting: Internal Medicine

## 2021-03-03 ENCOUNTER — Ambulatory Visit (INDEPENDENT_AMBULATORY_CARE_PROVIDER_SITE_OTHER): Payer: Medicare Other | Admitting: Podiatry

## 2021-03-03 ENCOUNTER — Ambulatory Visit (INDEPENDENT_AMBULATORY_CARE_PROVIDER_SITE_OTHER): Payer: Medicare Other

## 2021-03-03 ENCOUNTER — Encounter: Payer: Self-pay | Admitting: Podiatry

## 2021-03-03 ENCOUNTER — Other Ambulatory Visit: Payer: Self-pay

## 2021-03-03 DIAGNOSIS — M722 Plantar fascial fibromatosis: Secondary | ICD-10-CM | POA: Diagnosis not present

## 2021-03-03 MED ORDER — TRIAMCINOLONE ACETONIDE 10 MG/ML IJ SUSP
20.0000 mg | Freq: Once | INTRAMUSCULAR | Status: AC
  Administered 2021-03-03: 20 mg

## 2021-03-03 MED ORDER — DICLOFENAC SODIUM 75 MG PO TBEC: 75.0000 mg | DELAYED_RELEASE_TABLET | Freq: Two times a day (BID) | ORAL | 2 refills | Status: DC

## 2021-03-03 NOTE — Progress Notes (Signed)
Subjective:   Patient ID: Charles Cannon, male   DOB: 65 y.o.   MRN: [id number]   HPI Patient states he is developed severe pain in both his heels and he is a very active man and does not know why this happened.  States its been there for at least a month and it is worse when he gets up in the morning after periods of sitting and patient does not smoke likes to be active   Review of Systems  All other systems reviewed and are negative.      Objective:  Physical Exam Vitals and nursing note reviewed.  Constitutional:      Appearance: He is well-developed.  Pulmonary:     Effort: Pulmonary effort is normal.  Musculoskeletal:        General: Normal range of motion.  Skin:    General: Skin is warm.  Neurological:     Mental Status: He is alert.    Neurovascular status found to be intact muscle strength found to be adequate range of motion found to be within normal limits.  Patient is found to have significant discomfort medial fascial band bilateral with inflammation fluid of the tendon at the insertional point tendon calcaneus.  Patient is found to have good digital perfusion well oriented x3     Assessment:  Acute Planter fasciitis bilateral with moderate structural flatfoot deformity     Plan:  H&P x-rays reviewed sterile prep and injected the medial band plantar fascial bilateral 3 mg Kenalog 5 mg Xylocaine placed and fascial braces with instructions on usage bilateral instructed on physical therapy supportive shoe gear and shoe gear modifications and reappoint to recheck 2 weeks and also placed on diclofenac 75 mg twice daily  X-rays indicate moderate depression arch no indications of spurring or stress fracture

## 2021-03-03 NOTE — Patient Instructions (Signed)

## 2021-03-16 DIAGNOSIS — R059 Cough, unspecified: Secondary | ICD-10-CM | POA: Diagnosis not present

## 2021-03-16 DIAGNOSIS — J069 Acute upper respiratory infection, unspecified: Secondary | ICD-10-CM | POA: Diagnosis not present

## 2021-03-17 ENCOUNTER — Ambulatory Visit: Payer: Medicare Other | Admitting: Podiatry

## 2021-03-31 ENCOUNTER — Other Ambulatory Visit: Payer: Self-pay

## 2021-03-31 ENCOUNTER — Encounter: Payer: Self-pay | Admitting: Podiatry

## 2021-03-31 ENCOUNTER — Ambulatory Visit: Payer: Medicare Other | Admitting: Podiatry

## 2021-03-31 DIAGNOSIS — M722 Plantar fascial fibromatosis: Secondary | ICD-10-CM | POA: Diagnosis not present

## 2021-03-31 MED ORDER — TRIAMCINOLONE ACETONIDE 10 MG/ML IJ SUSP
10.0000 mg | Freq: Once | INTRAMUSCULAR | Status: AC
  Administered 2021-03-31: 10 mg

## 2021-04-01 NOTE — Progress Notes (Signed)
Subjective:   Patient ID: Charles Cannon, male   DOB: 65 y.o.   MRN: [id number]   HPI Patient states overall doing well with 1 spot on my right that has remained tender   ROS      Objective:  Physical Exam  Neurovascular status intact with inflammation pain of the right plantar fascial with the left doing well in the right moderately improved     Assessment:  Acute Planter fasciitis right still present but improved     Plan:  H&P reviewed condition sterile prep injected the fascia 3 mg Kenalog 5 mg Xylocaine and reappoint to recheck as needed

## 2021-07-12 ENCOUNTER — Encounter: Payer: Self-pay | Admitting: Internal Medicine

## 2021-07-12 ENCOUNTER — Emergency Department (HOSPITAL_COMMUNITY): Payer: Medicare Other

## 2021-07-12 ENCOUNTER — Other Ambulatory Visit: Payer: Self-pay

## 2021-07-12 ENCOUNTER — Emergency Department (HOSPITAL_COMMUNITY)
Admission: EM | Admit: 2021-07-12 | Discharge: 2021-07-12 | Disposition: A | Discharge status: Home / Self Care | Payer: Medicare Other | Attending: Emergency Medicine | Admitting: Emergency Medicine

## 2021-07-12 ENCOUNTER — Encounter (HOSPITAL_COMMUNITY): Payer: Self-pay

## 2021-07-12 DIAGNOSIS — R519 Headache, unspecified: Secondary | ICD-10-CM | POA: Diagnosis not present

## 2021-07-12 DIAGNOSIS — M542 Cervicalgia: Secondary | ICD-10-CM | POA: Insufficient documentation

## 2021-07-12 MED ORDER — KETOROLAC TROMETHAMINE 15 MG/ML IJ SOLN
15.0000 mg | Freq: Once | INTRAMUSCULAR | Status: AC
  Administered 2021-07-12: 09:00:00 15 mg via INTRAVENOUS
  Filled 2021-07-12: qty 1

## 2021-07-12 MED ORDER — PROCHLORPERAZINE EDISYLATE 10 MG/2ML IJ SOLN
10.0000 mg | Freq: Once | INTRAMUSCULAR | Status: AC
  Administered 2021-07-12: 08:00:00 10 mg via INTRAVENOUS
  Filled 2021-07-12: qty 2

## 2021-07-12 MED ORDER — VALACYCLOVIR HCL 1 G PO TABS: 1000.0000 mg | ORAL_TABLET | Freq: Three times a day (TID) | ORAL | 0 refills | Status: DC

## 2021-07-12 MED ORDER — DIPHENHYDRAMINE HCL 50 MG/ML IJ SOLN
12.5000 mg | Freq: Once | INTRAMUSCULAR | Status: AC
  Administered 2021-07-12: 08:00:00 12.5 mg via INTRAVENOUS
  Filled 2021-07-12: qty 1

## 2021-07-12 NOTE — Discharge Instructions (Addendum)
It was a pleasure taking care of you today. As discussed, your CT head did not show any abnormalities. Your pain could be related to early shingles. I am sending you home with treatment for shingles. Do not take unless you develop a rash. Follow-up with PCP if symptoms do not improve over the next week. Return to the ER for new or worsening symptoms.

## 2021-07-12 NOTE — ED Triage Notes (Signed)
Pt reports with headache x 3 days that is worsening.

## 2021-07-12 NOTE — ED Notes (Signed)
Pt in bed, pt reports a slight decrease in pain, states that he is ready to go home.

## 2021-07-12 NOTE — ED Provider Notes (Signed)
[facility] [facility] DEPT Provider Note   CSN: [id number] Arrival date & time: 07/12/21  3235     History Chief Complaint  Patient presents with   Headache    Charles Cannon is a 65 y.o. male with a past medical history significant for sickle cell trait, hyperplastic colon polyp, and history of leukopenia who presents to the ED due to a persistent left-sided, sharp, stabbing headache x3 days.  Patient states headache has been persistent with no relief from ibuprofen. He states pain changes from sharp to throbbing sensation throughout the day.  No history of headaches or migraines.  Denies recent head injury.  He is not currently on any anticoagulants.  Denies dizziness, nausea, vomiting, changes to vision, changes in speech, and unilateral weakness.  He also endorses some left-sided neck pain that started yesterday.  No fever or chills.  Denies neck stiffness.  He has been taking ibuprofen with no relief.  Denies associated rash.  History obtained from patient and past medical records. No interpreter used during encounter.       Past Medical History:  Diagnosis Date   Diverticulosis    Hyperplastic colon polyp    Inflammation of shoulder joint    right   Leukopenia    Sickle cell anemia (HCC)    has the trait    Patient Active Problem List   Diagnosis Date Noted   Bowel habit changes 12/04/2014   LLQ pain 12/04/2014   Leukocytopenia 06/25/2013    Past Surgical History:  Procedure Laterality Date   COLONOSCOPY     POLYPECTOMY     tooth implant         Family History  Problem Relation Age of Onset   Diabetes Brother    Heart disease Brother    Stomach cancer Maternal Aunt    Hypertension Brother    Hypertension Sister    Hypertension Mother    GER disease Mother    Dementia Mother    Stroke Mother    Seizures Mother    Colon cancer Neg Hx    Esophageal cancer Neg Hx    Rectal cancer Neg Hx     Social History   Tobacco Use    Smoking status: Never   Smokeless tobacco: Never  Substance Use Topics   Alcohol use: Yes    Alcohol/week: 6.0 standard drinks    Types: 6 Cans of beer per week   Drug use: No    Home Medications Prior to Admission medications   Medication Sig Start Date End Date Taking? Authorizing Provider  valACYclovir (VALTREX) 1000 MG tablet Take 1 tablet (1,000 mg total) by mouth 3 (three) times daily. 07/12/21  Yes Kerri Kovacik, Druscilla Brownie, PA-C  diclofenac (VOLTAREN) 75 MG EC tablet Take 1 tablet (75 mg total) by mouth 2 (two) times daily. 03/03/21   Wallene Huh, DPM  Multiple Vitamin (MULTI-VITAMIN) tablet Take 1 tablet by mouth daily.    [provider]  Omega-3 Fatty Acids (FISH OIL) 1000 MG CAPS 1 (one) time each day at the same time.    [provider]  tadalafil (CIALIS) 20 MG tablet 1 (one) time each day at the same time. 06/22/13   [provider]    Allergies    Patient has no known allergies.  Review of Systems   Review of Systems  Constitutional:  Negative for chills and fever.  Eyes:  Negative for photophobia and visual disturbance.  Musculoskeletal:  Positive for neck pain. Negative for  neck stiffness.  Neurological:  Positive for headaches. Negative for dizziness, speech difficulty and weakness.  All other systems reviewed and are negative.  Physical Exam Updated Vital Signs BP (!) 146/96 (BP Location: Right Arm)    Pulse 71    Temp 97.8 F (36.6 C) (Oral)    Resp 17    Ht 5\' 9"  (1.753 m)    Wt 85.3 kg    SpO2 99%    BMI 27.76 kg/m   Physical Exam Vitals and nursing note reviewed.  Constitutional:      General: He is not in acute distress.    Appearance: He is not ill-appearing.  HENT:     Head: Normocephalic.     Comments: No tenderness over temple region.  Eyes:     Pupils: Pupils are equal, round, and reactive to light.  Neck:     Comments: Reproducible tenderness to left side of neck.  No overlying rash.  Full range of motion of neck.   No meningismus.  No cervical midline tenderness. Cardiovascular:     Rate and Rhythm: Normal rate and regular rhythm.     Pulses: Normal pulses.     Heart sounds: Normal heart sounds. No murmur heard.   No friction rub. No gallop.  Pulmonary:     Effort: Pulmonary effort is normal.     Breath sounds: Normal breath sounds.  Abdominal:     General: Abdomen is flat. There is no distension.     Palpations: Abdomen is soft.     Tenderness: There is no abdominal tenderness. There is no guarding or rebound.  Musculoskeletal:        General: Normal range of motion.     Cervical back: Neck supple.  Skin:    General: Skin is warm and dry.  Neurological:     General: No focal deficit present.     Mental Status: He is alert.     Comments: Speech is clear, able to follow commands CN III-XII intact Normal strength in upper and lower extremities bilaterally including dorsiflexion and plantar flexion, strong and equal grip strength Sensation grossly intact throughout Moves extremities without ataxia, coordination intact No pronator drift Ambulates without difficulty  Psychiatric:        Mood and Affect: Mood normal.        Behavior: Behavior normal.    ED Results / Procedures / Treatments   Labs (all labs ordered are listed, but only abnormal results are displayed) Labs Reviewed - No data to display  EKG None  Radiology CT Head Wo Contrast  Result Date: 07/12/2021 CLINICAL DATA:  Increasing headache over the past 3 days. EXAM: CT HEAD WITHOUT CONTRAST TECHNIQUE: Contiguous axial images were obtained from the base of the skull through the vertex without intravenous contrast. COMPARISON:  None. FINDINGS: Brain: No evidence of acute infarction, hemorrhage, hydrocephalus, extra-axial collection or mass lesion/mass effect. Scattered mild periventricular and subcortical white matter hypodensities are nonspecific, but favored to reflect chronic microvascular ischemic changes. Vascular: No  hyperdense vessel or unexpected calcification. Skull: Normal. Negative for fracture or focal lesion. Sinuses/Orbits: No acute finding. Other: None. IMPRESSION: 1. No acute intracranial abnormality. 2. Mild chronic microvascular ischemic changes. Electronically Signed   By: Titus Dubin M.D.   On: 07/12/2021 08:36    Procedures Procedures   Medications Ordered in ED Medications  prochlorperazine (COMPAZINE) injection 10 mg (10 mg Intravenous Given 07/12/21 0750)  diphenhydrAMINE (BENADRYL) injection 12.5 mg (12.5 mg Intravenous Given 07/12/21 0747)  ketorolac (TORADOL)  15 MG/ML injection 15 mg (15 mg Intravenous Given 07/12/21 8786)    ED Course  I have reviewed the triage vital signs and the nursing notes.  Pertinent labs & imaging results that were available during my care of the patient were reviewed by me and considered in my medical decision making (see chart for details).    MDM Rules/Calculators/A&P                         65 year old male presents to the ED due to left-sided sharp and throbbing headache x3 days.  No recent head injury.  No nausea, vomiting, visual changes, speech changes, or dizziness.  Upon arrival, stable vitals.  Patient in no acute distress.  Reassuring physical exam.  Normal neurological exam.  No overlying rash to suggest shingles.  No meningismus.  Low suspicion for meningitis.  We will treat with migraine cocktail and obtain CT head to rule out any acute abnormalities.  If CT head is unremarkable, will add Toradol. Lower suspicion for giant cell arteritis.   9:59 AM Reassessed patient at bedside who notes improvement in headache after migraine cocktail. CT head personally reviewed which is negative for any acute abnormalities. Low suspicion for dissection. Symptoms could be related to early shingles. Patient discharged with Valtrex and advised to take medication if he develops a rash. Advised patient to follow-up with PCP if symptoms do not improve over the  next week. Strict ED precautions discussed with patient. Patient states understanding and agrees to plan. Patient discharged home in no acute distress and stable vitals  Discussed with Dr. Maryan Rued who evaluated patient at bedside and agrees with assessment and plan.     Final Clinical Impression(s) / ED Diagnoses Final diagnoses:  Acute nonintractable headache, unspecified headache type    Rx / DC Orders ED Discharge Orders          Ordered    valACYclovir (VALTREX) 1000 MG tablet  3 times daily        07/12/21 [address] 07/12/21 1029    Blanchie Dessert, MD 07/14/21 1351

## 2021-12-19 DIAGNOSIS — D72819 Decreased white blood cell count, unspecified: Secondary | ICD-10-CM | POA: Diagnosis not present

## 2021-12-19 DIAGNOSIS — Z0000 Encounter for general adult medical examination without abnormal findings: Secondary | ICD-10-CM | POA: Diagnosis not present

## 2021-12-19 DIAGNOSIS — E7800 Pure hypercholesterolemia, unspecified: Secondary | ICD-10-CM | POA: Diagnosis not present

## 2022-02-07 ENCOUNTER — Encounter: Payer: Self-pay | Admitting: Gastroenterology

## 2022-02-15 ENCOUNTER — Ambulatory Visit: Payer: Self-pay

## 2022-02-15 DIAGNOSIS — H2513 Age-related nuclear cataract, bilateral: Secondary | ICD-10-CM | POA: Diagnosis not present

## 2022-02-15 NOTE — Patient Outreach (Signed)
  Care Coordination   02/15/2022 Name: Mael Delap MRN: [id number] DOB: [birth year]   Care Coordination Outreach Attempts:  An unsuccessful telephone outreach was attempted today to offer the patient information about available care coordination services as a benefit of their health plan.   Follow Up Plan:  Additional outreach attempts will be made to offer the patient care coordination information and services.   Encounter Outcome:  No Answer  Care Coordination Interventions Activated:  No   Care Coordination Interventions:  No, not indicated    [facility] [phone]

## 2022-03-03 ENCOUNTER — Ambulatory Visit (AMBULATORY_SURGERY_CENTER): Payer: Self-pay

## 2022-03-03 VITALS — Ht 69.0 in | Wt 193.0 lb

## 2022-03-03 DIAGNOSIS — Z1211 Encounter for screening for malignant neoplasm of colon: Secondary | ICD-10-CM

## 2022-03-03 MED ORDER — PEG 3350-KCL-NA BICARB-NACL 420 G PO SOLR: 4000.0000 mL | Freq: Once | ORAL | 0 refills | Status: AC

## 2022-03-03 NOTE — Progress Notes (Signed)
No egg or soy allergy known to patient  No issues known to pt with past sedation with any surgeries or procedures Patient denies ever being told they had issues or difficulty with intubation  No FH of Malignant Hyperthermia Pt is not on diet pills Pt is not on  home 02  Pt is not on blood thinners  Pt denies issues with constipation  No A fib or A flutter Have any cardiac testing pending--denied Pt instructed to use [email or web address] or GoodRx for a price reduction on prep   

## 2022-03-08 ENCOUNTER — Ambulatory Visit: Payer: Self-pay

## 2022-03-08 NOTE — Patient Outreach (Signed)
  Care Coordination   03/08/2022 Name: Charles Cannon MRN: [id number] DOB: [birth year]   Care Coordination Outreach Attempts:  A second unsuccessful outreach was attempted today to offer the patient with information about available care coordination services as a benefit of their health plan.     Follow Up Plan:  Additional outreach attempts will be made to offer the patient care coordination information and services.   Encounter Outcome:  No Answer  Care Coordination Interventions Activated:  No   Care Coordination Interventions:  No, not indicated    [facility] [phone]

## 2022-03-09 ENCOUNTER — Encounter: Payer: Self-pay | Admitting: Gastroenterology

## 2022-03-13 ENCOUNTER — Encounter: Payer: Self-pay | Admitting: Internal Medicine

## 2022-03-22 ENCOUNTER — Encounter: Payer: Self-pay | Admitting: Gastroenterology

## 2022-03-22 ENCOUNTER — Ambulatory Visit (AMBULATORY_SURGERY_CENTER): Payer: Medicare Other | Admitting: Gastroenterology

## 2022-03-22 VITALS — BP 132/81 | HR 62 | Temp 96.2°F | Resp 14 | Ht 69.0 in | Wt 188.0 lb

## 2022-03-22 DIAGNOSIS — Z1211 Encounter for screening for malignant neoplasm of colon: Secondary | ICD-10-CM

## 2022-03-22 DIAGNOSIS — K635 Polyp of colon: Secondary | ICD-10-CM

## 2022-03-22 DIAGNOSIS — D125 Benign neoplasm of sigmoid colon: Secondary | ICD-10-CM

## 2022-03-22 DIAGNOSIS — D124 Benign neoplasm of descending colon: Secondary | ICD-10-CM | POA: Diagnosis not present

## 2022-03-22 MED ORDER — SODIUM CHLORIDE 0.9 % IV SOLN: 500.0000 mL | Freq: Once | INTRAVENOUS | Status: DC

## 2022-03-22 NOTE — Progress Notes (Signed)
Called to room to assist during endoscopic procedure.  Patient ID and intended procedure confirmed with present staff. Received instructions for my participation in the procedure from the performing physician.  

## 2022-03-22 NOTE — Op Note (Signed)
[facility] Patient Name: Charles Cannon Procedure Date: 03/22/2022 9:54 AM MRN: [id number] Endoscopist: Ladene Artist , MD Age: 66 Referring MD:  Date of Birth: October 30, 1955 Gender: Male Account #: [id number] Procedure:                Colonoscopy Indications:              Screening for colorectal malignant neoplasm Medicines:                Monitored Anesthesia Care Procedure:                Pre-Anesthesia Assessment:                           - Prior to the procedure, a History and Physical                            was performed, and patient medications and                            allergies were reviewed. The patient's tolerance of                            previous anesthesia was also reviewed. The risks                            and benefits of the procedure and the sedation                            options and risks were discussed with the patient.                            All questions were answered, and informed consent                            was obtained. Prior Anticoagulants: The patient has                            taken no previous anticoagulant or antiplatelet                            agents. ASA Grade Assessment: II - A patient with                            mild systemic disease. After reviewing the risks                            and benefits, the patient was deemed in                            satisfactory condition to undergo the procedure.                           After obtaining informed consent, the colonoscope  was passed under direct vision. Throughout the                            procedure, the patient's blood pressure, pulse, and                            oxygen saturations were monitored continuously. The                            CF [id number] #[id number] was introduced through the anus                            and advanced to the the cecum, identified by                            appendiceal orifice  and ileocecal valve. The                            ileocecal valve, appendiceal orifice, and rectum                            were photographed. The quality of the bowel                            preparation was good. The colonoscopy was performed                            without difficulty. The patient tolerated the                            procedure well. Scope In: 10:01:30 AM Scope Out: 10:12:52 AM Scope Withdrawal Time: 0 hours 9 minutes 35 seconds  Total Procedure Duration: 0 hours 11 minutes 22 seconds  Findings:                 The perianal and digital rectal examinations were                            normal.                           Two sessile polyps were found in the sigmoid colon                            and descending colon. The polyps were 5 to 6 mm in                            size. These polyps were removed with a cold snare.                            Resection and retrieval were complete.                           A few small-mouthed diverticula were found in the  left colon. There was no evidence of diverticular                            bleeding.                           Internal hemorrhoids were found during                            retroflexion. The hemorrhoids were small and Grade                            I (internal hemorrhoids that do not prolapse).                           The exam was otherwise without abnormality on                            direct and retroflexion views. Complications:            No immediate complications. Estimated blood loss:                            None. Estimated Blood Loss:     Estimated blood loss: none. Impression:               - Two 5 to 6 mm polyps in the sigmoid colon and in                            the descending colon, removed with a cold snare.                            Resected and retrieved.                           - Mild diverticulosis in the left colon.                            - Internal hemorrhoids.                           - The examination was otherwise normal on direct                            and retroflexion views. Recommendation:           - Repeat colonoscopy after studies are complete for                            surveillance based on pathology results.                           - Patient has a contact number available for                            emergencies. The signs and symptoms of potential  delayed complications were discussed with the                            patient. Return to normal activities tomorrow.                            Written discharge instructions were provided to the                            patient.                           - High fiber diet.                           - Continue present medications.                           - Await pathology results. Ladene Artist, MD 03/22/2022 10:16:29 AM This report has been signed electronically.

## 2022-03-22 NOTE — Progress Notes (Signed)
Pt's states no medical or surgical changes since previsit or office visit. 

## 2022-03-22 NOTE — Patient Instructions (Signed)
Handout on polyps, hemorrhoids, diverticulosis, and high fiber diet given to patient. Await pathology results. Resume previous diet and continue present medications Repeat colonoscopy for surveillance will be determined based off of pathology results.   YOU HAD AN ENDOSCOPIC PROCEDURE TODAY AT [facility]:   Refer to the procedure report that was given to you for any specific questions about what was found during the examination.  If the procedure report does not answer your questions, please call your gastroenterologist to clarify.  If you requested that your care partner not be given the details of your procedure findings, then the procedure report has been included in a sealed envelope for you to review at your convenience later.  YOU SHOULD EXPECT: Some feelings of bloating in the abdomen. Passage of more gas than usual.  Walking can help get rid of the air that was put into your GI tract during the procedure and reduce the bloating. If you had a lower endoscopy (such as a colonoscopy or flexible sigmoidoscopy) you may notice spotting of blood in your stool or on the toilet paper. If you underwent a bowel prep for your procedure, you may not have a normal bowel movement for a few days.  Please Note:  You might notice some irritation and congestion in your nose or some drainage.  This is from the oxygen used during your procedure.  There is no need for concern and it should clear up in a day or so.  SYMPTOMS TO REPORT IMMEDIATELY:  Following lower endoscopy (colonoscopy or flexible sigmoidoscopy):  Excessive amounts of blood in the stool  Significant tenderness or worsening of abdominal pains  Swelling of the abdomen that is new, acute  Fever of 100F or higher  For urgent or emergent issues, a gastroenterologist can be reached at any hour by calling [phone]. Do not use MyChart messaging for urgent concerns.    DIET:  We do recommend a small meal at first, but then  you may proceed to your regular diet.  Drink plenty of fluids but you should avoid alcoholic beverages for 24 hours.  ACTIVITY:  You should plan to take it easy for the rest of today and you should NOT DRIVE or use heavy machinery until tomorrow (because of the sedation medicines used during the test).    FOLLOW UP: Our staff will call the number listed on your records the next business day following your procedure.  We will call around 7:15- 8:00 am to check on you and address any questions or concerns that you may have regarding the information given to you following your procedure. If we do not reach you, we will leave a message.  If you develop any symptoms (ie: fever, flu-like symptoms, shortness of breath, cough etc.) before then, please call [phone].  If you test positive for Covid 19 in the 2 weeks post procedure, please call and report this information to [place].    If any biopsies were taken you will be contacted by phone or by letter within the next 1-3 weeks.  Please call us at [phone] if you have not heard about the biopsies in 3 weeks.    SIGNATURES/CONFIDENTIALITY: You and/or your care partner have signed paperwork which will be entered into your electronic medical record.  These signatures attest to the fact that that the information above on your After Visit Summary has been reviewed and is understood.  Full responsibility of the confidentiality of this discharge information lies with you and/or  your care-partner.

## 2022-03-22 NOTE — Progress Notes (Signed)
History & Physical  Primary Care Physician:  Seward Carol, MD Primary Gastroenterologist: Lucio Edward, MD  CHIEF COMPLAINT:  CRC screening  HPI: Charles Cannon is a 66 y.o. male for CRC screening, average risk, with colonoscopy.   Past Medical History:  Diagnosis Date   Diverticulosis    Hyperplastic colon polyp    Inflammation of shoulder joint    right   Leukopenia    Sickle cell anemia (HCC)    has the trait    Past Surgical History:  Procedure Laterality Date   COLONOSCOPY     KNEE ARTHROSCOPY Right    88yr ago   POLYPECTOMY     tooth implant      Prior to Admission medications   Medication Sig Start Date End Date Taking? Authorizing Provider  Multiple Vitamin (MULTI-VITAMIN) tablet Take 1 tablet by mouth daily.   Yes [provider]  Omega-3 Fatty Acids (FISH OIL) 1000 MG CAPS 1 (one) time each day at the same time.   Yes [provider]  tadalafil (CIALIS) 20 MG tablet 1 (one) time each day at the same time. 06/22/13   [provider]    Current Outpatient Medications  Medication Sig Dispense Refill   Multiple Vitamin (MULTI-VITAMIN) tablet Take 1 tablet by mouth daily.     Omega-3 Fatty Acids (FISH OIL) 1000 MG CAPS 1 (one) time each day at the same time.     tadalafil (CIALIS) 20 MG tablet 1 (one) time each day at the same time.     Current Facility-Administered Medications  Medication Dose Route Frequency Provider Last Rate Last Admin   0.9 %  sodium chloride infusion  500 mL Intravenous Once SLadene Artist MD        Allergies as of 03/22/2022   (No Known Allergies)    Family History  Problem Relation Age of Onset   Diabetes Brother    Heart disease Brother    Stomach cancer Maternal Aunt    Hypertension Brother    Hypertension Sister    Hypertension Mother    GER disease Mother    Dementia Mother    Stroke Mother    Seizures Mother    Colon cancer Neg Hx    Esophageal cancer Neg Hx    Rectal cancer Neg  Hx     Social History   Socioeconomic History   Marital status: Married    Spouse name: Not on file   Number of children: 1   Years of education: Not on file   Highest education level: Not on file  Occupational History   Occupation: semi-retired  Tobacco Use   Smoking status: Never   Smokeless tobacco: Never  Vaping Use   Vaping Use: Never used  Substance and Sexual Activity   Alcohol use: Yes    Alcohol/week: 6.0 standard drinks of alcohol    Types: 6 Cans of beer per week   Drug use: No   Sexual activity: Not on file  Other Topics Concern   Not on file  Social History Narrative   Not on file   Social Determinants of Health   Financial Resource Strain: Not on file  Food Insecurity: Not on file  Transportation Needs: Not on file  Physical Activity: Not on file  Stress: Not on file  Social Connections: Not on file  Intimate Partner Violence: Not on file    Review of Systems:  All systems reviewed were negative except where noted in HPI.   Physical  Exam: General:  Alert, well-developed, in NAD Head:  Normocephalic and atraumatic. Eyes:  Sclera clear, no icterus.   Conjunctiva pink. Ears:  Normal auditory acuity. Mouth:  No deformity or lesions.  Neck:  Supple; no masses . Lungs:  Clear throughout to auscultation.   No wheezes, crackles, or rhonchi. No acute distress. Heart:  Regular rate and rhythm; no murmurs. Abdomen:  Soft, nondistended, nontender. No masses, hepatomegaly. No obvious masses.  Normal bowel .    Rectal:  Deferred   Msk:  Symmetrical without gross deformities.. Pulses:  Normal pulses noted. Extremities:  Without edema. Neurologic:  Alert and  oriented x4;  grossly normal neurologically. Skin:  Intact without significant lesions or rashes. Cervical Nodes:  No significant cervical adenopathy. Psych:  Alert and cooperative. Normal mood and affect.   Impression / Plan:   CRC screening, average risk, for colonoscopy.  Pricilla Riffle. [facility] Plan   03/22/2022, 9:56 AM See Shea Evans, [facility], to contact our on call provider

## 2022-03-22 NOTE — Progress Notes (Signed)
A and O x3. Report to RN. Tolerated MAC anesthesia well. 

## 2022-03-23 ENCOUNTER — Telehealth: Payer: Self-pay | Admitting: *Deleted

## 2022-03-23 NOTE — Telephone Encounter (Signed)
  Follow up Call-     03/22/2022    9:40 AM  Call back number  Post procedure Call Back phone  # [phone]  Permission to leave phone message Yes     Patient questions:  Do you have a fever, pain , or abdominal swelling? No. Pain Score  0 *  Have you tolerated food without any problems? Yes.    Have you been able to return to your normal activities? Yes.    Do you have any questions about your discharge instructions: Diet   No. Medications  No. Follow up visit  No.  Do you have questions or concerns about your Care? No.  Actions: * If pain score is 4 or above: No action needed, pain <4.

## 2022-04-05 ENCOUNTER — Encounter: Payer: Self-pay | Admitting: Gastroenterology

## 2022-05-17 ENCOUNTER — Ambulatory Visit: Payer: Medicare Other | Admitting: Podiatry

## 2022-10-25 DIAGNOSIS — M47896 Other spondylosis, lumbar region: Secondary | ICD-10-CM | POA: Diagnosis not present

## 2022-10-25 DIAGNOSIS — S76311A Strain of muscle, fascia and tendon of the posterior muscle group at thigh level, right thigh, initial encounter: Secondary | ICD-10-CM | POA: Diagnosis not present

## 2022-11-15 DIAGNOSIS — S76311A Strain of muscle, fascia and tendon of the posterior muscle group at thigh level, right thigh, initial encounter: Secondary | ICD-10-CM | POA: Diagnosis not present

## 2023-03-06 DIAGNOSIS — Z0000 Encounter for general adult medical examination without abnormal findings: Secondary | ICD-10-CM | POA: Diagnosis not present

## 2023-03-06 DIAGNOSIS — D72819 Decreased white blood cell count, unspecified: Secondary | ICD-10-CM | POA: Diagnosis not present

## 2023-03-06 DIAGNOSIS — E7800 Pure hypercholesterolemia, unspecified: Secondary | ICD-10-CM | POA: Diagnosis not present

## 2023-03-06 DIAGNOSIS — Z23 Encounter for immunization: Secondary | ICD-10-CM | POA: Diagnosis not present

## 2023-03-06 DIAGNOSIS — Z5181 Encounter for therapeutic drug level monitoring: Secondary | ICD-10-CM | POA: Diagnosis not present

## 2023-03-14 DIAGNOSIS — J324 Chronic pansinusitis: Secondary | ICD-10-CM | POA: Diagnosis not present

## 2023-03-14 DIAGNOSIS — J301 Allergic rhinitis due to pollen: Secondary | ICD-10-CM | POA: Diagnosis not present

## 2023-03-14 DIAGNOSIS — K219 Gastro-esophageal reflux disease without esophagitis: Secondary | ICD-10-CM | POA: Diagnosis not present

## 2023-03-14 DIAGNOSIS — J311 Chronic nasopharyngitis: Secondary | ICD-10-CM | POA: Diagnosis not present

## 2023-03-28 DIAGNOSIS — M9901 Segmental and somatic dysfunction of cervical region: Secondary | ICD-10-CM | POA: Diagnosis not present

## 2023-03-28 DIAGNOSIS — M5136 Other intervertebral disc degeneration, lumbar region: Secondary | ICD-10-CM | POA: Diagnosis not present

## 2023-03-28 DIAGNOSIS — M5134 Other intervertebral disc degeneration, thoracic region: Secondary | ICD-10-CM | POA: Diagnosis not present

## 2023-03-28 DIAGNOSIS — M9902 Segmental and somatic dysfunction of thoracic region: Secondary | ICD-10-CM | POA: Diagnosis not present

## 2023-03-28 DIAGNOSIS — M5032 Other cervical disc degeneration, mid-cervical region, unspecified level: Secondary | ICD-10-CM | POA: Diagnosis not present

## 2023-03-28 DIAGNOSIS — M9903 Segmental and somatic dysfunction of lumbar region: Secondary | ICD-10-CM | POA: Diagnosis not present

## 2023-04-18 DIAGNOSIS — M5134 Other intervertebral disc degeneration, thoracic region: Secondary | ICD-10-CM | POA: Diagnosis not present

## 2023-04-18 DIAGNOSIS — M9901 Segmental and somatic dysfunction of cervical region: Secondary | ICD-10-CM | POA: Diagnosis not present

## 2023-04-18 DIAGNOSIS — M5416 Radiculopathy, lumbar region: Secondary | ICD-10-CM | POA: Diagnosis not present

## 2023-04-18 DIAGNOSIS — M5032 Other cervical disc degeneration, mid-cervical region, unspecified level: Secondary | ICD-10-CM | POA: Diagnosis not present

## 2023-04-18 DIAGNOSIS — M9902 Segmental and somatic dysfunction of thoracic region: Secondary | ICD-10-CM | POA: Diagnosis not present

## 2023-04-18 DIAGNOSIS — M9903 Segmental and somatic dysfunction of lumbar region: Secondary | ICD-10-CM | POA: Diagnosis not present

## 2023-04-30 DIAGNOSIS — J311 Chronic nasopharyngitis: Secondary | ICD-10-CM | POA: Diagnosis not present

## 2023-04-30 DIAGNOSIS — K219 Gastro-esophageal reflux disease without esophagitis: Secondary | ICD-10-CM | POA: Diagnosis not present

## 2023-05-02 DIAGNOSIS — M9901 Segmental and somatic dysfunction of cervical region: Secondary | ICD-10-CM | POA: Diagnosis not present

## 2023-05-02 DIAGNOSIS — M5032 Other cervical disc degeneration, mid-cervical region, unspecified level: Secondary | ICD-10-CM | POA: Diagnosis not present

## 2023-05-02 DIAGNOSIS — M9903 Segmental and somatic dysfunction of lumbar region: Secondary | ICD-10-CM | POA: Diagnosis not present

## 2023-05-02 DIAGNOSIS — M9902 Segmental and somatic dysfunction of thoracic region: Secondary | ICD-10-CM | POA: Diagnosis not present

## 2023-05-02 DIAGNOSIS — M5416 Radiculopathy, lumbar region: Secondary | ICD-10-CM | POA: Diagnosis not present

## 2023-05-02 DIAGNOSIS — M5134 Other intervertebral disc degeneration, thoracic region: Secondary | ICD-10-CM | POA: Diagnosis not present

## 2023-05-16 DIAGNOSIS — M5134 Other intervertebral disc degeneration, thoracic region: Secondary | ICD-10-CM | POA: Diagnosis not present

## 2023-05-16 DIAGNOSIS — M9901 Segmental and somatic dysfunction of cervical region: Secondary | ICD-10-CM | POA: Diagnosis not present

## 2023-05-16 DIAGNOSIS — M9902 Segmental and somatic dysfunction of thoracic region: Secondary | ICD-10-CM | POA: Diagnosis not present

## 2023-05-16 DIAGNOSIS — M5032 Other cervical disc degeneration, mid-cervical region, unspecified level: Secondary | ICD-10-CM | POA: Diagnosis not present

## 2023-05-16 DIAGNOSIS — M9903 Segmental and somatic dysfunction of lumbar region: Secondary | ICD-10-CM | POA: Diagnosis not present

## 2023-05-23 DIAGNOSIS — M5032 Other cervical disc degeneration, mid-cervical region, unspecified level: Secondary | ICD-10-CM | POA: Diagnosis not present

## 2023-05-23 DIAGNOSIS — M5134 Other intervertebral disc degeneration, thoracic region: Secondary | ICD-10-CM | POA: Diagnosis not present

## 2023-05-23 DIAGNOSIS — M9902 Segmental and somatic dysfunction of thoracic region: Secondary | ICD-10-CM | POA: Diagnosis not present

## 2023-05-23 DIAGNOSIS — M9901 Segmental and somatic dysfunction of cervical region: Secondary | ICD-10-CM | POA: Diagnosis not present

## 2023-05-23 DIAGNOSIS — M9903 Segmental and somatic dysfunction of lumbar region: Secondary | ICD-10-CM | POA: Diagnosis not present

## 2023-05-30 DIAGNOSIS — M5032 Other cervical disc degeneration, mid-cervical region, unspecified level: Secondary | ICD-10-CM | POA: Diagnosis not present

## 2023-05-30 DIAGNOSIS — M9901 Segmental and somatic dysfunction of cervical region: Secondary | ICD-10-CM | POA: Diagnosis not present

## 2023-05-30 DIAGNOSIS — M5134 Other intervertebral disc degeneration, thoracic region: Secondary | ICD-10-CM | POA: Diagnosis not present

## 2023-05-30 DIAGNOSIS — M9902 Segmental and somatic dysfunction of thoracic region: Secondary | ICD-10-CM | POA: Diagnosis not present

## 2023-05-30 DIAGNOSIS — M9903 Segmental and somatic dysfunction of lumbar region: Secondary | ICD-10-CM | POA: Diagnosis not present

## 2023-06-06 DIAGNOSIS — M9902 Segmental and somatic dysfunction of thoracic region: Secondary | ICD-10-CM | POA: Diagnosis not present

## 2023-06-06 DIAGNOSIS — M9901 Segmental and somatic dysfunction of cervical region: Secondary | ICD-10-CM | POA: Diagnosis not present

## 2023-06-06 DIAGNOSIS — M5032 Other cervical disc degeneration, mid-cervical region, unspecified level: Secondary | ICD-10-CM | POA: Diagnosis not present

## 2023-06-06 DIAGNOSIS — H2513 Age-related nuclear cataract, bilateral: Secondary | ICD-10-CM | POA: Diagnosis not present

## 2023-06-06 DIAGNOSIS — M5134 Other intervertebral disc degeneration, thoracic region: Secondary | ICD-10-CM | POA: Diagnosis not present

## 2023-06-06 DIAGNOSIS — M9903 Segmental and somatic dysfunction of lumbar region: Secondary | ICD-10-CM | POA: Diagnosis not present

## 2023-06-20 DIAGNOSIS — M9902 Segmental and somatic dysfunction of thoracic region: Secondary | ICD-10-CM | POA: Diagnosis not present

## 2023-06-20 DIAGNOSIS — M5134 Other intervertebral disc degeneration, thoracic region: Secondary | ICD-10-CM | POA: Diagnosis not present

## 2023-06-20 DIAGNOSIS — M9903 Segmental and somatic dysfunction of lumbar region: Secondary | ICD-10-CM | POA: Diagnosis not present

## 2023-06-20 DIAGNOSIS — M5032 Other cervical disc degeneration, mid-cervical region, unspecified level: Secondary | ICD-10-CM | POA: Diagnosis not present

## 2023-06-20 DIAGNOSIS — M9901 Segmental and somatic dysfunction of cervical region: Secondary | ICD-10-CM | POA: Diagnosis not present

## 2023-07-04 DIAGNOSIS — K219 Gastro-esophageal reflux disease without esophagitis: Secondary | ICD-10-CM | POA: Diagnosis not present

## 2023-07-04 DIAGNOSIS — J311 Chronic nasopharyngitis: Secondary | ICD-10-CM | POA: Diagnosis not present

## 2023-07-23 DIAGNOSIS — M47896 Other spondylosis, lumbar region: Secondary | ICD-10-CM | POA: Diagnosis not present

## 2023-07-23 DIAGNOSIS — S76311A Strain of muscle, fascia and tendon of the posterior muscle group at thigh level, right thigh, initial encounter: Secondary | ICD-10-CM | POA: Diagnosis not present

## 2023-08-02 DIAGNOSIS — R600 Localized edema: Secondary | ICD-10-CM | POA: Diagnosis not present

## 2023-08-02 DIAGNOSIS — M25561 Pain in right knee: Secondary | ICD-10-CM | POA: Diagnosis not present

## 2023-08-02 DIAGNOSIS — S76311D Strain of muscle, fascia and tendon of the posterior muscle group at thigh level, right thigh, subsequent encounter: Secondary | ICD-10-CM | POA: Diagnosis not present

## 2023-08-09 DIAGNOSIS — S76311D Strain of muscle, fascia and tendon of the posterior muscle group at thigh level, right thigh, subsequent encounter: Secondary | ICD-10-CM | POA: Diagnosis not present

## 2023-08-09 DIAGNOSIS — R600 Localized edema: Secondary | ICD-10-CM | POA: Diagnosis not present

## 2023-08-09 DIAGNOSIS — M25561 Pain in right knee: Secondary | ICD-10-CM | POA: Diagnosis not present

## 2023-08-16 DIAGNOSIS — R600 Localized edema: Secondary | ICD-10-CM | POA: Diagnosis not present

## 2023-08-16 DIAGNOSIS — M25561 Pain in right knee: Secondary | ICD-10-CM | POA: Diagnosis not present

## 2023-08-16 DIAGNOSIS — S76311D Strain of muscle, fascia and tendon of the posterior muscle group at thigh level, right thigh, subsequent encounter: Secondary | ICD-10-CM | POA: Diagnosis not present

## 2023-08-30 DIAGNOSIS — S76311D Strain of muscle, fascia and tendon of the posterior muscle group at thigh level, right thigh, subsequent encounter: Secondary | ICD-10-CM | POA: Diagnosis not present

## 2023-08-30 DIAGNOSIS — M25561 Pain in right knee: Secondary | ICD-10-CM | POA: Diagnosis not present

## 2023-08-30 DIAGNOSIS — R600 Localized edema: Secondary | ICD-10-CM | POA: Diagnosis not present

## 2023-09-07 DIAGNOSIS — M25561 Pain in right knee: Secondary | ICD-10-CM | POA: Diagnosis not present

## 2023-09-07 DIAGNOSIS — R600 Localized edema: Secondary | ICD-10-CM | POA: Diagnosis not present

## 2023-09-07 DIAGNOSIS — S76311D Strain of muscle, fascia and tendon of the posterior muscle group at thigh level, right thigh, subsequent encounter: Secondary | ICD-10-CM | POA: Diagnosis not present

## 2023-09-13 DIAGNOSIS — R600 Localized edema: Secondary | ICD-10-CM | POA: Diagnosis not present

## 2023-09-13 DIAGNOSIS — M25561 Pain in right knee: Secondary | ICD-10-CM | POA: Diagnosis not present

## 2023-09-13 DIAGNOSIS — S76311D Strain of muscle, fascia and tendon of the posterior muscle group at thigh level, right thigh, subsequent encounter: Secondary | ICD-10-CM | POA: Diagnosis not present

## 2023-09-20 DIAGNOSIS — R600 Localized edema: Secondary | ICD-10-CM | POA: Diagnosis not present

## 2023-09-20 DIAGNOSIS — S76311D Strain of muscle, fascia and tendon of the posterior muscle group at thigh level, right thigh, subsequent encounter: Secondary | ICD-10-CM | POA: Diagnosis not present

## 2023-09-20 DIAGNOSIS — M25561 Pain in right knee: Secondary | ICD-10-CM | POA: Diagnosis not present

## 2023-09-27 DIAGNOSIS — R600 Localized edema: Secondary | ICD-10-CM | POA: Diagnosis not present

## 2023-09-27 DIAGNOSIS — M25561 Pain in right knee: Secondary | ICD-10-CM | POA: Diagnosis not present

## 2023-09-27 DIAGNOSIS — S76311D Strain of muscle, fascia and tendon of the posterior muscle group at thigh level, right thigh, subsequent encounter: Secondary | ICD-10-CM | POA: Diagnosis not present

## 2024-04-09 DIAGNOSIS — M25562 Pain in left knee: Secondary | ICD-10-CM | POA: Diagnosis not present

## 2024-04-14 DIAGNOSIS — M25462 Effusion, left knee: Secondary | ICD-10-CM | POA: Diagnosis not present

## 2024-05-05 DIAGNOSIS — M5032 Other cervical disc degeneration, mid-cervical region, unspecified level: Secondary | ICD-10-CM | POA: Diagnosis not present

## 2024-05-05 DIAGNOSIS — M9902 Segmental and somatic dysfunction of thoracic region: Secondary | ICD-10-CM | POA: Diagnosis not present

## 2024-05-05 DIAGNOSIS — M9901 Segmental and somatic dysfunction of cervical region: Secondary | ICD-10-CM | POA: Diagnosis not present

## 2024-05-05 DIAGNOSIS — M5416 Radiculopathy, lumbar region: Secondary | ICD-10-CM | POA: Diagnosis not present

## 2024-05-05 DIAGNOSIS — M5134 Other intervertebral disc degeneration, thoracic region: Secondary | ICD-10-CM | POA: Diagnosis not present

## 2024-05-05 DIAGNOSIS — M9903 Segmental and somatic dysfunction of lumbar region: Secondary | ICD-10-CM | POA: Diagnosis not present
